# Patient Record
Sex: Male | Born: 1975
Health system: Southern US, Community
[De-identification: ages and names within clinical notes are randomized; demographics above are authoritative.]

## PROBLEM LIST (undated history)

## (undated) DIAGNOSIS — F1910 Other psychoactive substance abuse, uncomplicated: Secondary | ICD-10-CM

---

## 2018-01-16 ENCOUNTER — Emergency Department (HOSPITAL_COMMUNITY): Payer: Medicaid Other

## 2018-01-16 ENCOUNTER — Other Ambulatory Visit: Payer: Self-pay

## 2018-01-16 ENCOUNTER — Encounter (HOSPITAL_COMMUNITY): Payer: Self-pay | Admitting: Radiology

## 2018-01-16 ENCOUNTER — Inpatient Hospital Stay (HOSPITAL_COMMUNITY)
Admission: EM | Admit: 2018-01-16 | Discharge: 2018-02-15 | DRG: 296 | Disposition: E | Payer: Medicaid Other | Attending: Pulmonary Disease | Admitting: Pulmonary Disease

## 2018-01-16 DIAGNOSIS — I959 Hypotension, unspecified: Secondary | ICD-10-CM | POA: Diagnosis present

## 2018-01-16 DIAGNOSIS — N179 Acute kidney failure, unspecified: Secondary | ICD-10-CM | POA: Diagnosis present

## 2018-01-16 DIAGNOSIS — F1510 Other stimulant abuse, uncomplicated: Secondary | ICD-10-CM | POA: Diagnosis present

## 2018-01-16 DIAGNOSIS — G9382 Brain death: Secondary | ICD-10-CM | POA: Diagnosis present

## 2018-01-16 DIAGNOSIS — R402112 Coma scale, eyes open, never, at arrival to emergency department: Secondary | ICD-10-CM | POA: Diagnosis present

## 2018-01-16 DIAGNOSIS — E875 Hyperkalemia: Secondary | ICD-10-CM | POA: Diagnosis present

## 2018-01-16 DIAGNOSIS — R740 Nonspecific elevation of levels of transaminase and lactic acid dehydrogenase [LDH]: Secondary | ICD-10-CM | POA: Diagnosis present

## 2018-01-16 DIAGNOSIS — G936 Cerebral edema: Secondary | ICD-10-CM | POA: Diagnosis present

## 2018-01-16 DIAGNOSIS — G931 Anoxic brain damage, not elsewhere classified: Secondary | ICD-10-CM | POA: Diagnosis present

## 2018-01-16 DIAGNOSIS — E7889 Other lipoprotein metabolism disorders: Secondary | ICD-10-CM

## 2018-01-16 DIAGNOSIS — R402212 Coma scale, best verbal response, none, at arrival to emergency department: Secondary | ICD-10-CM | POA: Diagnosis present

## 2018-01-16 DIAGNOSIS — R0989 Other specified symptoms and signs involving the circulatory and respiratory systems: Secondary | ICD-10-CM

## 2018-01-16 DIAGNOSIS — Z452 Encounter for adjustment and management of vascular access device: Secondary | ICD-10-CM

## 2018-01-16 DIAGNOSIS — J9601 Acute respiratory failure with hypoxia: Secondary | ICD-10-CM

## 2018-01-16 DIAGNOSIS — J9600 Acute respiratory failure, unspecified whether with hypoxia or hypercapnia: Secondary | ICD-10-CM | POA: Diagnosis present

## 2018-01-16 DIAGNOSIS — I469 Cardiac arrest, cause unspecified: Principal | ICD-10-CM

## 2018-01-16 DIAGNOSIS — R402312 Coma scale, best motor response, none, at arrival to emergency department: Secondary | ICD-10-CM | POA: Diagnosis present

## 2018-01-16 DIAGNOSIS — E8889 Other specified metabolic disorders: Secondary | ICD-10-CM

## 2018-01-16 HISTORY — DX: Other psychoactive substance abuse, uncomplicated: F19.10

## 2018-01-16 LAB — RAPID URINE DRUG SCREEN, HOSP PERFORMED
Amphetamines: POSITIVE — AB
BENZODIAZEPINES: NOT DETECTED
Barbiturates: NOT DETECTED
Cocaine: NOT DETECTED
OPIATES: NOT DETECTED
Tetrahydrocannabinol: NOT DETECTED

## 2018-01-16 LAB — COOXEMETRY PANEL
Carboxyhemoglobin: 2.3 % — ABNORMAL HIGH (ref 0.5–1.5)
Methemoglobin: 1.2 % (ref 0.0–1.5)
O2 SAT: 97.8 %
TOTAL HEMOGLOBIN: 12.9 g/dL (ref 12.0–16.0)

## 2018-01-16 LAB — CBC
HCT: 39.4 % (ref 39.0–52.0)
HCT: 42.7 % (ref 39.0–52.0)
HEMOGLOBIN: 14.2 g/dL (ref 13.0–17.0)
Hemoglobin: 12.3 g/dL — ABNORMAL LOW (ref 13.0–17.0)
MCH: 30.4 pg (ref 26.0–34.0)
MCH: 31.3 pg (ref 26.0–34.0)
MCHC: 31.2 g/dL (ref 30.0–36.0)
MCHC: 33.3 g/dL (ref 30.0–36.0)
MCV: 97.3 fL (ref 78.0–100.0)
MCV: 94.1 fL (ref 78.0–100.0)
PLATELETS: 146 10*3/uL — AB (ref 150–400)
Platelets: 207 10*3/uL (ref 150–400)
RBC: 4.05 MIL/uL — ABNORMAL LOW (ref 4.22–5.81)
RBC: 4.54 MIL/uL (ref 4.22–5.81)
RDW: 14.8 % (ref 11.5–15.5)
RDW: 14.6 % (ref 11.5–15.5)
WBC: 24.8 10*3/uL — AB (ref 4.0–10.5)
WBC: 20 10*3/uL — ABNORMAL HIGH (ref 4.0–10.5)

## 2018-01-16 LAB — URINALYSIS, ROUTINE W REFLEX MICROSCOPIC
BACTERIA UA: NONE SEEN
BILIRUBIN URINE: NEGATIVE
Glucose, UA: 50 mg/dL — AB
KETONES UR: NEGATIVE mg/dL
Leukocytes, UA: NEGATIVE
Nitrite: NEGATIVE
Protein, ur: 100 mg/dL — AB
Specific Gravity, Urine: 1.014 (ref 1.005–1.030)
WBC UA: NONE SEEN WBC/hpf (ref 0–5)
pH: 7 (ref 5.0–8.0)

## 2018-01-16 LAB — POCT I-STAT 3, ART BLOOD GAS (G3+)
Acid-base deficit: 2 mmol/L (ref 0.0–2.0)
Bicarbonate: 29.5 mmol/L — ABNORMAL HIGH (ref 20.0–28.0)
O2 Saturation: 100 %
PCO2 ART: 77 mmHg — AB (ref 32.0–48.0)
Patient temperature: 33.9
TCO2: 32 mmol/L (ref 22–32)
pH, Arterial: 7.172 — CL (ref 7.350–7.450)
pO2, Arterial: 419 mmHg — ABNORMAL HIGH (ref 83.0–108.0)

## 2018-01-16 LAB — BASIC METABOLIC PANEL
Anion gap: 17 — ABNORMAL HIGH (ref 5–15)
BUN: 18 mg/dL (ref 6–20)
CALCIUM: 8.3 mg/dL — AB (ref 8.9–10.3)
CHLORIDE: 106 mmol/L (ref 101–111)
CO2: 16 mmol/L — ABNORMAL LOW (ref 22–32)
CREATININE: 1.58 mg/dL — AB (ref 0.61–1.24)
GFR calc Af Amer: >60 mL/min (ref >60)
GFR calc non Af Amer: 52 mL/min — ABNORMAL LOW (ref >60)
Glucose, Bld: 129 mg/dL — ABNORMAL HIGH (ref 65–99)
Potassium: 5.5 mmol/L — ABNORMAL HIGH (ref 3.5–5.1)
SODIUM: 139 mmol/L (ref 135–145)

## 2018-01-16 LAB — HEPATIC FUNCTION PANEL
ALBUMIN: 3.2 g/dL — AB (ref 3.5–5.0)
ALT: 271 U/L — ABNORMAL HIGH (ref 17–63)
AST: 446 U/L — ABNORMAL HIGH (ref 15–41)
Alkaline Phosphatase: 125 U/L (ref 38–126)
BILIRUBIN TOTAL: 0.8 mg/dL (ref 0.3–1.2)
Bilirubin, Direct: 0.2 mg/dL (ref 0.1–0.5)
Indirect Bilirubin: 0.6 mg/dL (ref 0.3–0.9)
Total Protein: 5.8 g/dL — ABNORMAL LOW (ref 6.5–8.1)

## 2018-01-16 LAB — I-STAT CG4 LACTIC ACID, ED: Lactic Acid, Venous: 9.94 mmol/L (ref 0.5–1.9)

## 2018-01-16 LAB — MRSA PCR SCREENING: MRSA by PCR: NEGATIVE

## 2018-01-16 LAB — TROPONIN I: TROPONIN I: 0.24 ng/mL — AB (ref <0.03)

## 2018-01-16 LAB — CREATININE, SERUM
Creatinine, Ser: 1.52 mg/dL — ABNORMAL HIGH (ref 0.61–1.24)
GFR calc Af Amer: >60 mL/min (ref >60)
GFR calc non Af Amer: 55 mL/min — ABNORMAL LOW (ref >60)

## 2018-01-16 LAB — MAGNESIUM: Magnesium: 2.8 mg/dL — ABNORMAL HIGH (ref 1.7–2.4)

## 2018-01-16 LAB — I-STAT TROPONIN, ED: TROPONIN I, POC: 0.01 ng/mL (ref 0.00–0.08)

## 2018-01-16 LAB — ETHANOL

## 2018-01-16 LAB — AMMONIA: Ammonia: 87 umol/L — ABNORMAL HIGH (ref 9–35)

## 2018-01-16 MED ORDER — IOPAMIDOL (ISOVUE-300) INJECTION 61%
INTRAVENOUS | Status: AC
  Administered 2018-01-16: 100 mL
  Filled 2018-01-16: qty 100

## 2018-01-16 MED ORDER — CHLORHEXIDINE GLUCONATE 0.12% ORAL RINSE (MEDLINE KIT)
15.0000 mL | Freq: Two times a day (BID) | OROMUCOSAL | Status: DC
  Administered 2018-01-16 – 2018-01-18 (×4): 15 mL via OROMUCOSAL

## 2018-01-16 MED ORDER — ENOXAPARIN SODIUM 40 MG/0.4ML ~~LOC~~ SOLN
40.0000 mg | SUBCUTANEOUS | Status: DC
  Administered 2018-01-16: 40 mg via SUBCUTANEOUS
  Filled 2018-01-16: qty 0.4

## 2018-01-16 MED ORDER — DEXTROSE 5 % IV SOLN
0.0000 ug/min | Freq: Once | INTRAVENOUS | Status: AC
  Administered 2018-01-16: 10 ug/min via INTRAVENOUS

## 2018-01-16 MED ORDER — NOREPINEPHRINE BITARTRATE 1 MG/ML IV SOLN
0.0000 ug/min | Freq: Once | INTRAVENOUS | Status: AC
  Administered 2018-01-16: 35 ug/min via INTRAVENOUS
  Filled 2018-01-16 (×3): qty 4

## 2018-01-16 MED ORDER — SODIUM CHLORIDE 0.9 % IV BOLUS (SEPSIS): 1000.0000 mL | Freq: Once | INTRAVENOUS | Status: DC

## 2018-01-16 MED ORDER — SODIUM CHLORIDE 0.9 % IV SOLN
250.0000 mL | INTRAVENOUS | Status: DC | PRN
  Administered 2018-01-16: 250 mL via INTRAVENOUS
  Administered 2018-01-17: 10 mL via INTRAVENOUS

## 2018-01-16 MED ORDER — SODIUM CHLORIDE 0.9 % IV SOLN
INTRAVENOUS | Status: AC | PRN
  Administered 2018-01-16: 1000 mL via INTRAVENOUS

## 2018-01-16 MED ORDER — PIPERACILLIN-TAZOBACTAM 3.375 G IVPB
3.3750 g | Freq: Three times a day (TID) | INTRAVENOUS | Status: DC
  Administered 2018-01-16 – 2018-01-18 (×5): 3.375 g via INTRAVENOUS
  Filled 2018-01-16 (×6): qty 50

## 2018-01-16 MED ORDER — SODIUM CHLORIDE 0.9 % IV SOLN: INTRAVENOUS | Status: DC

## 2018-01-16 MED ORDER — SODIUM BICARBONATE 8.4 % IV SOLN
INTRAVENOUS | Status: DC
  Administered 2018-01-16 – 2018-01-17 (×6): via INTRAVENOUS
  Filled 2018-01-16 (×14): qty 150

## 2018-01-16 MED ORDER — SODIUM CHLORIDE 0.9 % IV BOLUS (SEPSIS)
1000.0000 mL | Freq: Once | INTRAVENOUS | Status: AC
  Administered 2018-01-16: 1000 mL via INTRAVENOUS

## 2018-01-16 MED ORDER — VASOPRESSIN 20 UNIT/ML IV SOLN
0.0400 [IU]/min | INTRAVENOUS | Status: DC
Start: 2018-01-16 — End: 2018-01-19
  Administered 2018-01-16: 0.04 [IU]/min via INTRAVENOUS
  Filled 2018-01-16 (×2): qty 2

## 2018-01-16 MED ORDER — NALOXONE HCL 2 MG/2ML IJ SOSY: 1.0000 mg | PREFILLED_SYRINGE | Freq: Once | INTRAMUSCULAR | Status: DC

## 2018-01-16 MED ORDER — SODIUM BICARBONATE 8.4 % IV SOLN
50.0000 meq | Freq: Once | INTRAVENOUS | Status: AC
  Administered 2018-01-16: 50 meq via INTRAVENOUS

## 2018-01-16 MED ORDER — NOREPINEPHRINE BITARTRATE 1 MG/ML IV SOLN
5.0000 ug/min | INTRAVENOUS | Status: DC
  Administered 2018-01-16: 40 ug/min via INTRAVENOUS
  Filled 2018-01-16 (×2): qty 4

## 2018-01-16 MED ORDER — FAMOTIDINE IN NACL 20-0.9 MG/50ML-% IV SOLN
20.0000 mg | Freq: Two times a day (BID) | INTRAVENOUS | Status: DC
  Administered 2018-01-16 – 2018-01-18 (×4): 20 mg via INTRAVENOUS
  Filled 2018-01-16 (×4): qty 50

## 2018-01-16 MED ORDER — ORAL CARE MOUTH RINSE
15.0000 mL | OROMUCOSAL | Status: DC
  Administered 2018-01-16 – 2018-01-18 (×16): 15 mL via OROMUCOSAL

## 2018-01-16 NOTE — Progress Notes (Signed)
Responded to page for family of this patient.  Place family in consultation room and accompanied MD to update family.  Family lives sort of estranged.  Biological father - Joshua DevonCarl W. Colon [phone]) says he  Is the next of kin but hasn't seen his son in over a month or two or longer.  States that the son doesn't visit him.  Step-mother Joshua Colon [phone]).  Biological mother hasn't seen him in over 40 years.  Other family members may be visiting.  Father and step mom left to feed horses and said they would call back or visit once he is in a room.  Chaplain will follow up as needed.    02/01/2018 1630  Clinical Encounter Type  Visited With Family;Health care provider;Patient not available  Visit Type Initial;Spiritual support;ED;Trauma

## 2018-01-16 NOTE — ED Notes (Signed)
Attempted to insert temp foley , meet resistance unable to advance 2nd RN to attempt

## 2018-01-16 NOTE — ED Notes (Signed)
[facility] notified , referral number [id number]

## 2018-01-16 NOTE — H&P (Addendum)
PULMONARY / CRITICAL CARE MEDICINE   Name: Joshua Colon MRN: [id number] DOB: 08-19-1976    ADMISSION DATE:  February 06, 2018  CHIEF COMPLAINT: Out of hospital cardiac arrest.  HISTORY OF PRESENT ILLNESS:       The patient was found unresponsive in the field with an unknown downtime.  He was apneic and pulseless when EMS arrived.  CPR was initiated he was given 1 mg of epinephrine followed by several small boluses and return of circulation was established.  He was reintubated in the department of emergency medicine and did not require any drugs for intubation.  Family has left apparently they reported a history of drug abuse before departing.  PAST MEDICAL HISTORY :  He  has a past medical history of Drug abuse (HCC).  PAST SURGICAL HISTORY: He  has no past surgical history on file.  No Known Allergies  No current facility-administered medications on file prior to encounter.    No current outpatient medications on file prior to encounter.    FAMILY HISTORY:  His has no family status information on file.    SOCIAL HISTORY: He  reports that he drinks alcohol. He reports that he uses drugs.  REVIEW OF SYSTEMS:   Unobtainable  SUBJECTIVE:  Unobtainable  VITAL SIGNS: BP 103/73   Pulse 85   Temp (!) 95.9 F (35.5 C) (Temporal)   Resp 16   Ht 5' 9.5" (1.765 m)   Wt 130 lb (59 kg)   SpO2 100%   BMI 18.92 kg/m   HEMODYNAMICS:    VENTILATOR SETTINGS: Vent Mode: PRVC FiO2 (%):  [100 %] 100 % Set Rate:  [16 bmp] 16 bmp Vt Set:  [600 mL] 600 mL PEEP:  [5 cmH20] 5 cmH20  INTAKE / OUTPUT: No intake/output data recorded.  PHYSICAL EXAMINATION: General: This is a thin male who appears his stated age who is orally intubated and mechanically ventilated Neuro: There is no response to voice loud noise or sternal rub.  Pupils are fixed at 7 mm.  EOMs are absent by doll's eyes.  Corneals are absent.  Cough is absent.  Gag is absent. Cardiovascular: S1 and S2 are regular  without murmur rub or gallop. Lungs: There is symmetric air movement some scattered rhonchi and no wheezes. Abdomen: The abdomen is flat and soft without any organomegaly masses or tenderness.  He is anicteric. Skin: Appreciate any track marks or edema extremities warm  LABS:  BMET No results for input(s): NA, K, CL, CO2, BUN, CREATININE, GLUCOSE in the last 168 hours.  Electrolytes No results for input(s): CALCIUM, MG, PHOS in the last 168 hours.  CBC Recent Labs  Lab 02-06-18 1600  WBC 24.8*  HGB 12.3*  HCT 39.4  PLT 146*    Coag's No results for input(s): APTT, INR in the last 168 hours.  Sepsis Markers Recent Labs  Lab 02-06-2018 1607  LATICACIDVEN 9.94*    ABG No results for input(s): PHART, PCO2ART, PO2ART in the last 168 hours.  Liver Enzymes No results for input(s): AST, ALT, ALKPHOS, BILITOT, ALBUMIN in the last 168 hours.  Cardiac Enzymes No results for input(s): TROPONINI, PROBNP in the last 168 hours.  Glucose No results for input(s): GLUCAP in the last 168 hours.  Imaging Dg Chest Portable 1 View  Result Date: 02-06-18 CLINICAL DATA:  Status post CPR EXAM: PORTABLE CHEST 1 VIEW COMPARISON:  None. FINDINGS: Endotracheal tube with the tip 4.9 cm above the carina. There is no focal parenchymal opacity. There is no  pleural effusion or pneumothorax. The heart and mediastinal contours are unremarkable. The osseous structures are unremarkable. IMPRESSION: Endotracheal tube with the tip 4.9 cm above the carina. Electronically Signed   By: Elige KoHetal  Patel   On: 01/20/2018 16:19   Dg Abd Portable 1 View  Result Date: 01/27/2018 CLINICAL DATA:  Orogastric tube placement. EXAM: PORTABLE ABDOMEN - 1 VIEW COMPARISON:  None. FINDINGS: Orogastric tube tip passes below the diaphragm. Tip lies in the proximal to mid stomach. Side hole lies at the level of the gastroesophageal junction. IMPRESSION: Orogastric tube tip within the proximal to mid stomach. Electronically Signed    By: Amie Portlandavid  Ormond M.D.   On: 01/21/2018 16:14     STUDIES:  CT scan of the head to my eye shows diffuse cerebral edema with a most complete obliteration of the sulci.  I am awaiting  radiology's interpretation.   SIGNIFICANT EVENTS:   DISCUSSION:      This is a 42 year old with a reported history of drug abuse who was found down in the field.  Downtime is unknown.  CT scan following CPR is already showing diffuse cerebral edema and by my exam the patient shows no evidence of cortical or brainstem activity.  I cannot formally pronounced brain dead until I see a toxicology screen and his electrolytes however I am convinced that this is a situation beyond salvage based on the CT scan and I will not be initiating a hypothermia protocol.  I am anticipating further manifestations of brain death including diabetes insipidus and poikilothermia.  Greater than 32 minutes was spent in the care of this acutely unstable patient today   Penny PiaWJ Gray, MD Pulmonary and Critical Care Medicine [facility] Pager: [phone]  02/11/2018, 5:12 PM  ADDENDUM (9:22 PM): CTSP by E-link for "increasing pressor requirements". Notes reviewed. Case discussed with nurse. Apparently, there were 2 different order for norepinephrine in the computer with discrepancy in max dosing. BP 101/83 on norepinephrine @ 37 mcg/min. On physical exam, no Doll's eyes, no response to noxious stimuli. Synchronous with ventilator at RR 24. No ABG on file.  Will get ABG. If pH > 7.4 and hypocapnic, will not plan to make significant changes to allow hyperventilation. Add vasopressin PRN for blood pressure support.  Marcelle SmilingSeong-Joo Merritt Kibby, MD

## 2018-01-16 NOTE — ED Notes (Signed)
After 2 RN's attempted temp foley, Dr Denton LankSteinl placed a coude foley

## 2018-01-16 NOTE — ED Notes (Signed)
I Stat Lactic Acid results 9.94 READ TO DR Denton LankSTEINL, REPEATED BACK

## 2018-01-16 NOTE — ED Notes (Signed)
Pt arrived with [place] ems after being found in his yard apneic and pulseless. Unknown event and unknown downtime. CPR started immediately, rosc achieved in routearound 1515. Pt received 1 epi infection and 4 pressor epi pushes. BP 80/40, NSR 96 bpm. Pt is intubated, has an IV and an IO. GCS 3. Pupils dilated and nonreactive. Pt has pulse.

## 2018-01-16 NOTE — ED Notes (Signed)
Pt back from CT escorted by [place] and RT

## 2018-01-16 NOTE — Progress Notes (Signed)
Pharmacy Antibiotic Note  Joshua Colon is a 42 y.o. male admitted on 01/26/2018 after an unknown downtime and unresponsiveness with ROSC after CPR.  Pharmacy has been consulted for Zosyn dosing for aspiration PNA coverage.   Plan: 1. Zosyn 3.375g IV every 8 hours infused over 4 hours 2. Will continue to follow renal function, culture results, LOT, and antibiotic de-escalation plans   Height: 5' 9.5" (176.5 cm) Weight: 130 lb (59 kg) IBW/kg (Calculated) : 71.85  Temp (24hrs), Avg:95.9 F (35.5 C), Min:95.9 F (35.5 C), Max:95.9 F (35.5 C)  Recent Labs  Lab 02/01/2018 1600 01/24/2018 1607  WBC 24.8*  --   CREATININE 1.58*  --   LATICACIDVEN  --  9.94*    Estimated Creatinine Clearance: 50.3 mL/min (A) (by C-G formula based on SCr of 1.58 mg/dL (H)).    No Known Allergies  Antimicrobials this admission: Zosyn 3/2 >>  Dose adjustments this admission: n/a  Microbiology results: n/a  Thank you for allowing pharmacy to be a part of this patient's care.  Rolley SimsMartin, Jabe Jeanbaptiste Ann 01/19/2018 5:44 PM

## 2018-01-16 NOTE — ED Provider Notes (Signed)
[facility]H CARDIOVASCULAR ICU Provider Note   CSN: [id number] Arrival date & time: 01/27/2018  1529     History   Chief Complaint Chief Complaint  Patient presents with  . Cardiac Arrest    HPI Markeith Jue is a 42 y.o. male.  HPI  42 year old male history of illicit drug abuse presents the emergency department after family members found the patient down for unknown period of time in the front yard.  EMS found the patient apneic/pulseless in PEA arrest subsequently given 1 dose of epinephrine along with push dose levo fed.  Patient had airway secured with Edison Pace LT subsequently had ROS C.  Past Medical History:  Diagnosis Date  . Drug abuse [facility])     Patient Active Problem List   Diagnosis Date Noted  . Anoxic brain damage ([place]) 01/15/2018    History reviewed. No pertinent surgical history.     Home Medications    Prior to Admission medications   Not on File    Family History History reviewed. No pertinent family history.  Social History Social History   Tobacco Use  . Smoking status: Unknown If Ever Smoked  Substance Use Topics  . Alcohol use: Yes    Comment: family states "pt had been using alchol today"  . Drug use: Yes    Comment: hx of drug abuse per family     Allergies   Patient has no known allergies.   Review of Systems Review of Systems  Unable to perform ROS: Acuity of condition     Physical Exam Updated Vital Signs BP 94/60   Pulse (!) 112   Temp (!) 92.3 F (33.5 C)   Resp (!) 24   Ht 5' 9.5" (1.765 m)   Wt 59 kg (130 lb)   SpO2 100%   BMI 18.92 kg/m   Physical Exam  Physical Exam Vitals:   02/01/2018 2030 01/27/2018 2100  BP: (!) 87/61 94/60  Pulse:    Resp: (!) 24 (!) 24  Temp:  (!) 92.3 F (33.5 C)  SpO2:     Constitutional: Patient is in severe acute distress Head: Normocephalic and atraumatic.  Eyes: Extraocular motion intact, no scleral icterus; 38m dilated nonreactive equal Neck: Supple without  meningismus, mass, or overt JVD Respiratory: BVM breath sounds not appreciated;  CV: Heart regular rate and rhythm, no obvious murmurs.  Pulses +2 and symmetric Abdomen: distended MSK: Extremities are atraumatic without deformity, ROM intact Skin: Warm, dry, intact Neuro: GCS 3   ED Treatments / Results  Labs (all labs ordered are listed, but only abnormal results are displayed) Labs Reviewed  HEPATIC FUNCTION PANEL - Abnormal; Notable for the following components:      Result Value   Total Protein 5.8 (*)    Albumin 3.2 (*)    AST 446 (*)    ALT 271 (*)    All other components within normal limits  BASIC METABOLIC PANEL - Abnormal; Notable for the following components:   Potassium 5.5 (*)    CO2 16 (*)    Glucose, Bld 129 (*)    Creatinine, Ser 1.58 (*)    Calcium 8.3 (*)    GFR calc non Af Amer 52 (*)    Anion gap 17 (*)    All other components within normal limits  CBC - Abnormal; Notable for the following components:   WBC 24.8 (*)    RBC 4.05 (*)    Hemoglobin 12.3 (*)    Platelets 146 (*)  All other components within normal limits  RAPID URINE DRUG SCREEN, HOSP PERFORMED - Abnormal; Notable for the following components:   Amphetamines POSITIVE (*)    All other components within normal limits  URINALYSIS, ROUTINE W REFLEX MICROSCOPIC - Abnormal; Notable for the following components:   APPearance HAZY (*)    Glucose, UA 50 (*)    Hgb urine dipstick LARGE (*)    Protein, ur 100 (*)    Squamous Epithelial / LPF 0-5 (*)    All other components within normal limits  COOXEMETRY PANEL - Abnormal; Notable for the following components:   Carboxyhemoglobin 2.3 (*)    All other components within normal limits  AMMONIA - Abnormal; Notable for the following components:   Ammonia 87 (*)    All other components within normal limits  TROPONIN I - Abnormal; Notable for the following components:   Troponin I 0.24 (*)    All other components within normal limits  MAGNESIUM -  Abnormal; Notable for the following components:   Magnesium 2.8 (*)    All other components within normal limits  CBC - Abnormal; Notable for the following components:   WBC 20.0 (*)    All other components within normal limits  CREATININE, SERUM - Abnormal; Notable for the following components:   Creatinine, Ser 1.52 (*)    GFR calc non Af Amer 55 (*)    All other components within normal limits  I-STAT CG4 LACTIC ACID, ED - Abnormal; Notable for the following components:   Lactic Acid, Venous 9.94 (*)    All other components within normal limits  MRSA PCR SCREENING  ETHANOL  BLOOD GAS, ARTERIAL  CARBOXYHEMOGLOBIN - COOX  TROPONIN I  TROPONIN I  DRUG PROFILE, UR, 9 DRUGS ([facility])  HIV ANTIBODY (ROUTINE TESTING)  BLOOD GAS, ARTERIAL  PHOSPHORUS  MAGNESIUM  CBC  COMPREHENSIVE METABOLIC PANEL  I-STAT CHEM 8, ED  I-STAT TROPONIN, ED  I-STAT TROPONIN, ED  I-STAT CHEM 8, ED  I-STAT CG4 LACTIC ACID, ED    EKG  EKG Interpretation  Date/Time:  Saturday January 16 2018 15:38:36 EST Ventricular Rate:  83 PR Interval:    QRS Duration: 88 QT Interval:  433 QTC Calculation: 509 R Axis:   27 Text Interpretation:  Sinus rhythm Nonspecific T wave abnormality Prolonged QT interval Confirmed by Lajean Saver [phone]) on 01/15/2018 3:45:30 PM       Radiology Ct Head Wo Contrast  Result Date: 01/25/2018 CLINICAL DATA:  42 year old male found down in his yard. Apneic and pulseless upon arrival. Pupils are dilated and nonreactive. EXAM: CT HEAD WITHOUT CONTRAST CT CERVICAL SPINE WITHOUT CONTRAST TECHNIQUE: Multidetector CT imaging of the head and cervical spine was performed following the standard protocol without intravenous contrast. Multiplanar CT image reconstructions of the cervical spine were also generated. COMPARISON:  None. FINDINGS: CT HEAD FINDINGS: Brain: Differentiation mild diffuse loss. Diffuse of gray-white effacement of the cortical sulci. Effacement of the basal cisterns.  All these findings suggest diffuse brain edema, likely from anoxic injury. No acute intracranial hemorrhage, hydrocephalus or mass lesion. Vascular: No hyperdense vessel or unexpected calcification. Skull: Normal. Negative for fracture or focal lesion. Sinuses/Orbits: No acute finding. Other: None. CT CERVICAL SPINE FINDINGS Alignment: Normal. Skull base and vertebrae: No acute fracture. No primary bone lesion or focal pathologic process. Soft tissues and spinal canal: No prevertebral fluid or swelling. No visible canal hematoma. Disc levels:  No significant degenerative disc disease. Upper chest: Unremarkable. Other: Intubated patient. Nasogastric tube is mildly  coiled in the oropharynx. IMPRESSION: 1. Appearance of the brain suggests diffuse cerebral and cerebellar edema related to recent anoxic brain injury, as above. 2. No acute abnormality of the cervical spine. Critical Value/emergent results were called by telephone at the time of interpretation on 02/04/2018 at 5:23 pm to Dr. Lajean Saver, who verbally acknowledged these results. Electronically Signed   By: Vinnie Langton M.D.   On: 01/15/2018 17:24   Ct Cervical Spine Wo Contrast  Result Date: 02/08/2018 CLINICAL DATA:  42 year old male found down in his yard. Apneic and pulseless upon arrival. Pupils are dilated and nonreactive. EXAM: CT HEAD WITHOUT CONTRAST CT CERVICAL SPINE WITHOUT CONTRAST TECHNIQUE: Multidetector CT imaging of the head and cervical spine was performed following the standard protocol without intravenous contrast. Multiplanar CT image reconstructions of the cervical spine were also generated. COMPARISON:  None. FINDINGS: CT HEAD FINDINGS: Brain: Differentiation mild diffuse loss. Diffuse of gray-white effacement of the cortical sulci. Effacement of the basal cisterns. All these findings suggest diffuse brain edema, likely from anoxic injury. No acute intracranial hemorrhage, hydrocephalus or mass lesion. Vascular: No hyperdense  vessel or unexpected calcification. Skull: Normal. Negative for fracture or focal lesion. Sinuses/Orbits: No acute finding. Other: None. CT CERVICAL SPINE FINDINGS Alignment: Normal. Skull base and vertebrae: No acute fracture. No primary bone lesion or focal pathologic process. Soft tissues and spinal canal: No prevertebral fluid or swelling. No visible canal hematoma. Disc levels:  No significant degenerative disc disease. Upper chest: Unremarkable. Other: Intubated patient. Nasogastric tube is mildly coiled in the oropharynx. IMPRESSION: 1. Appearance of the brain suggests diffuse cerebral and cerebellar edema related to recent anoxic brain injury, as above. 2. No acute abnormality of the cervical spine. Critical Value/emergent results were called by telephone at the time of interpretation on 02/08/2018 at 5:23 pm to Dr. Lajean Saver, who verbally acknowledged these results. Electronically Signed   By: Vinnie Langton M.D.   On: 01/20/2018 17:24   Ct Abdomen Pelvis W Contrast  Result Date: 01/15/2018 CLINICAL DATA:  42 year old male found down in his yard apneic and pulseless. Pupils are dilated and nonreactive. EXAM: CT ABDOMEN AND PELVIS WITH CONTRAST TECHNIQUE: Multidetector CT imaging of the abdomen and pelvis was performed using the standard protocol following bolus administration of intravenous contrast. CONTRAST:  145m ISOVUE-300 IOPAMIDOL (ISOVUE-300) INJECTION 61% COMPARISON:  None. FINDINGS: Lower chest: Areas of dependent atelectasis are noted in the lower lobes of the lungs bilaterally. Hepatobiliary: No cystic or solid hepatic lesions. No intra or extrahepatic biliary ductal dilatation. Gallbladder is nearly completely decompressed, but otherwise unremarkable in appearance. Pancreas: No pancreatic mass. No pancreatic ductal dilatation. No pancreatic or peripancreatic fluid or inflammatory changes. Spleen: Unremarkable. Adrenals/Urinary Tract: Bilateral kidneys and bilateral adrenal glands are  normal in appearance. No hydroureteronephrosis. Urinary bladder is normal in appearance. Foley balloon catheter with tip in the lumen of the urinary bladder. Small amount of gas non dependently in the lumen of the urinary bladder, presumably iatrogenic. Stomach/Bowel: Nasogastric tube terminating in the body of the stomach. Stomach is mildly distended, but otherwise unremarkable in appearance. No pathologic dilatation of small bowel or colon. Normal appendix. Vascular/Lymphatic: Aortic atherosclerosis, without evidence of aneurysm or dissection in the abdominal or pelvic vasculature. No lymphadenopathy noted in the abdomen or pelvis. Reproductive: Prostate gland and seminal vesicles are unremarkable in appearance. Other: No significant volume of ascites.  No pneumoperitoneum. Musculoskeletal: No acute displaced fractures or aggressive appearing lytic or blastic lesions are noted in the visualized portions of the  skeleton. IMPRESSION: 1. No acute findings are noted in the abdomen or pelvis. 2. Bibasilar subsegmental atelectasis in the lower lobes of the lungs bilaterally. 3. Aortic atherosclerosis. Aortic Atherosclerosis (ICD10-I70.0). Electronically Signed   By: Vinnie Langton M.D.   On: 01/24/2018 17:28   Dg Chest Portable 1 View  Result Date: 02/03/2018 CLINICAL DATA:  Status post CPR EXAM: PORTABLE CHEST 1 VIEW COMPARISON:  None. FINDINGS: Endotracheal tube with the tip 4.9 cm above the carina. There is no focal parenchymal opacity. There is no pleural effusion or pneumothorax. The heart and mediastinal contours are unremarkable. The osseous structures are unremarkable. IMPRESSION: Endotracheal tube with the tip 4.9 cm above the carina. Electronically Signed   By: Kathreen Devoid   On: 02/12/2018 16:19   Dg Abd Portable 1 View  Result Date: 02/02/2018 CLINICAL DATA:  Orogastric tube placement. EXAM: PORTABLE ABDOMEN - 1 VIEW COMPARISON:  None. FINDINGS: Orogastric tube tip passes below the diaphragm. Tip  lies in the proximal to mid stomach. Side hole lies at the level of the gastroesophageal junction. IMPRESSION: Orogastric tube tip within the proximal to mid stomach. Electronically Signed   By: Lajean Manes M.D.   On: 01/15/2018 16:14    Procedures Procedures (including critical care time)  Medications Ordered in ED Medications  sodium bicarbonate 150 mEq in dextrose 5 % 1,000 mL infusion ( Intravenous New Bag/Given 02/12/2018 2104)  naloxone [facility]) injection 1 mg (not administered)  0.9 %  sodium chloride infusion (not administered)  enoxaparin (LOVENOX) injection 40 mg (40 mg Subcutaneous Given 01/28/2018 1944)  famotidine (PEPCID) IVPB 20 mg premix (not administered)  0.9 %  sodium chloride infusion (not administered)  piperacillin-tazobactam (ZOSYN) IVPB 3.375 g (3.375 g Intravenous New Bag/Given 02/14/2018 2151)  chlorhexidine gluconate (MEDLINE KIT) (PERIDEX) 0.12 % solution 15 mL (15 mLs Mouth Rinse Given 01/29/2018 1853)  MEDLINE mouth rinse (not administered)  sodium chloride 0.9 % bolus 1,000 mL (not administered)  vasopressin (PITRESSIN) 40 Units in sodium chloride 0.9 % 250 mL (0.16 Units/mL) infusion (0.04 Units/min Intravenous New Bag/Given 02/01/2018 2141)  norepinephrine (LEVOPHED) 4 mg in dextrose 5 % 250 mL (0.016 mg/mL) infusion (not administered)  norepinephrine (LEVOPHED) 4 mg in dextrose 5 % 250 mL (0.016 mg/mL) infusion (12 mcg/min Intravenous Rate/Dose Change 02/04/2018 1630)  0.9 %  sodium chloride infusion ( Intravenous Stopped 01/26/2018 1612)  sodium bicarbonate injection 50 mEq (50 mEq Intravenous Given 01/31/2018 1618)  iopamidol (ISOVUE-300) 61 % injection (100 mLs  Contrast Given 01/19/2018 1641)  sodium chloride 0.9 % bolus 1,000 mL (0 mLs Intravenous Stopped 02/06/2018 1707)  sodium chloride 0.9 % bolus 1,000 mL (0 mLs Intravenous Stopped 01/31/2018 1707)     Initial Impression / Assessment and Plan / ED Course  I have reviewed the triage vital signs and the nursing notes.  Pertinent labs &  imaging results that were available during my care of the patient were reviewed by me and considered in my medical decision making (see chart for details).     43 year old male history of illicit drug abuse presents the emergency department after family members found the patient down for unknown period of time in the front yard.  EMS found the patient apneic/pulseless in PEA arrest subsequently given 1 dose of epinephrine along with push dose levo fed.   Patient had airway secured with Edison Pace LT subsequently had ROSC. Patient arrived lung sounds were not strongly appreciated.  Reviewed and found endotracheal tube with cuff inflated above the cords curled in  the mouth/airway.  Patient was reintubated without complications.  Upon arrival patient was hypotensive with a palpable pulse.  Levophed drip was initiated.  Airway confirmed with x-ray.  Initial labs showed leukocytosis of 24.8 thousand, hemoglobin 12.3, hyperkalemia 5.5, AK I creatinine 1.58, transaminitis,EtOH undetectable, urine drug screen positive for amphetamines initial troponin 0 0.01, with no findings on EKG concerning for ACS/arrhythmia.  Review of CT imaging to include head/C-spine/abdominal pelvis with only findings  suggestive of anoxic brain injury.    ICU consulted plan for admission for PEA arrest/ROSC with likely anoxic brain injury.   Final Clinical Impressions(s) / ED Diagnoses   Final diagnoses:  None    ED Discharge Orders    None       Willette Alma, DO 01/25/2018 2203    Lajean Saver, MD 01/20/2018 2302

## 2018-01-17 ENCOUNTER — Inpatient Hospital Stay (HOSPITAL_COMMUNITY): Payer: Medicaid Other

## 2018-01-17 DIAGNOSIS — I361 Nonrheumatic tricuspid (valve) insufficiency: Secondary | ICD-10-CM

## 2018-01-17 DIAGNOSIS — G9382 Brain death: Secondary | ICD-10-CM

## 2018-01-17 DIAGNOSIS — R570 Cardiogenic shock: Secondary | ICD-10-CM

## 2018-01-17 LAB — COMPREHENSIVE METABOLIC PANEL
ALBUMIN: 2.7 g/dL — AB (ref 3.5–5.0)
ALK PHOS: 90 U/L (ref 38–126)
ALK PHOS: 57 U/L (ref 38–126)
ALT: 512 U/L — AB (ref 17–63)
ALT: 335 U/L — AB (ref 17–63)
ANION GAP: 14 (ref 5–15)
AST: 988 U/L — AB (ref 15–41)
AST: 527 U/L — AB (ref 15–41)
Albumin: 1.9 g/dL — ABNORMAL LOW (ref 3.5–5.0)
Anion gap: 14 (ref 5–15)
BILIRUBIN TOTAL: 0.6 mg/dL (ref 0.3–1.2)
BUN: 32 mg/dL — AB (ref 6–20)
BUN: 38 mg/dL — AB (ref 6–20)
CALCIUM: 6.3 mg/dL — AB (ref 8.9–10.3)
CHLORIDE: 86 mmol/L — AB (ref 101–111)
CO2: 30 mmol/L (ref 22–32)
CO2: 33 mmol/L — AB (ref 22–32)
CREATININE: 2.76 mg/dL — AB (ref 0.61–1.24)
CREATININE: 3.25 mg/dL — AB (ref 0.61–1.24)
Calcium: 5.3 mg/dL — CL (ref 8.9–10.3)
Chloride: 93 mmol/L — ABNORMAL LOW (ref 101–111)
GFR calc Af Amer: 31 mL/min — ABNORMAL LOW (ref >60)
GFR calc Af Amer: 25 mL/min — ABNORMAL LOW (ref >60)
GFR calc non Af Amer: 27 mL/min — ABNORMAL LOW (ref >60)
GFR, EST NON AFRICAN AMERICAN: 22 mL/min — AB (ref >60)
GLUCOSE: 129 mg/dL — AB (ref 65–99)
Glucose, Bld: 202 mg/dL — ABNORMAL HIGH (ref 65–99)
Potassium: 3.6 mmol/L (ref 3.5–5.1)
Potassium: 2.6 mmol/L — CL (ref 3.5–5.1)
SODIUM: 137 mmol/L (ref 135–145)
SODIUM: 133 mmol/L — AB (ref 135–145)
Total Bilirubin: 1 mg/dL (ref 0.3–1.2)
Total Protein: 4.9 g/dL — ABNORMAL LOW (ref 6.5–8.1)
Total Protein: 3.6 g/dL — ABNORMAL LOW (ref 6.5–8.1)

## 2018-01-17 LAB — URINALYSIS, ROUTINE W REFLEX MICROSCOPIC
BILIRUBIN URINE: NEGATIVE
Glucose, UA: 150 mg/dL — AB
Ketones, ur: NEGATIVE mg/dL
Nitrite: NEGATIVE
Protein, ur: 100 mg/dL — AB
SPECIFIC GRAVITY, URINE: 1.027 (ref 1.005–1.030)
pH: 6 (ref 5.0–8.0)

## 2018-01-17 LAB — POCT I-STAT 3, ART BLOOD GAS (G3+)
ACID-BASE EXCESS: 4 mmol/L — AB (ref 0.0–2.0)
ACID-BASE EXCESS: 15 mmol/L — AB (ref 0.0–2.0)
Acid-Base Excess: 10 mmol/L — ABNORMAL HIGH (ref 0.0–2.0)
Acid-Base Excess: 10 mmol/L — ABNORMAL HIGH (ref 0.0–2.0)
Acid-Base Excess: 11 mmol/L — ABNORMAL HIGH (ref 0.0–2.0)
BICARBONATE: 30.9 mmol/L — AB (ref 20.0–28.0)
BICARBONATE: 35 mmol/L — AB (ref 20.0–28.0)
BICARBONATE: 41.4 mmol/L — AB (ref 20.0–28.0)
BICARBONATE: 36.3 mmol/L — AB (ref 20.0–28.0)
BICARBONATE: 37.4 mmol/L — AB (ref 20.0–28.0)
O2 SAT: 94 %
O2 Saturation: 99 %
O2 Saturation: 100 %
O2 Saturation: 100 %
O2 Saturation: 100 %
PCO2 ART: 58.2 mmHg — AB (ref 32.0–48.0)
PCO2 ART: 50.3 mmHg — AB (ref 32.0–48.0)
PCO2 ART: 56.1 mmHg — AB (ref 32.0–48.0)
PH ART: 7.474 — AB (ref 7.350–7.450)
PH ART: 7.464 — AB (ref 7.350–7.450)
PH ART: 7.458 — AB (ref 7.350–7.450)
PH ART: 7.427 (ref 7.350–7.450)
PO2 ART: 133 mmHg — AB (ref 83.0–108.0)
PO2 ART: 384 mmHg — AB (ref 83.0–108.0)
PO2 ART: 63 mmHg — AB (ref 83.0–108.0)
Patient temperature: 37.1
Patient temperature: 38
Patient temperature: 35
Patient temperature: 35.8
TCO2: 33 mmol/L — ABNORMAL HIGH (ref 22–32)
TCO2: 36 mmol/L — ABNORMAL HIGH (ref 22–32)
TCO2: 43 mmol/L — AB (ref 22–32)
TCO2: 38 mmol/L — AB (ref 22–32)
TCO2: 39 mmol/L — AB (ref 22–32)
pCO2 arterial: 48.1 mmHg — ABNORMAL HIGH (ref 32.0–48.0)
pCO2 arterial: 56.2 mmHg — ABNORMAL HIGH (ref 32.0–48.0)
pH, Arterial: 7.333 — ABNORMAL LOW (ref 7.350–7.450)
pO2, Arterial: 315 mmHg — ABNORMAL HIGH (ref 83.0–108.0)
pO2, Arterial: 299 mmHg — ABNORMAL HIGH (ref 83.0–108.0)

## 2018-01-17 LAB — ABO/RH
ABO/RH(D): O POS
ABO/RH(D): O POS

## 2018-01-17 LAB — CBC
HEMATOCRIT: 28.3 % — AB (ref 39.0–52.0)
HEMOGLOBIN: 8.7 g/dL — AB (ref 13.0–17.0)
MCH: 30.2 pg (ref 26.0–34.0)
MCHC: 30.7 g/dL (ref 30.0–36.0)
MCV: 98.3 fL (ref 78.0–100.0)
Platelets: 138 10*3/uL — ABNORMAL LOW (ref 150–400)
RBC: 2.88 MIL/uL — AB (ref 4.22–5.81)
RDW: 14.9 % (ref 11.5–15.5)
WBC: 10.4 10*3/uL (ref 4.0–10.5)

## 2018-01-17 LAB — CBC WITH DIFFERENTIAL/PLATELET
BASOS PCT: 0 %
Basophils Absolute: 0 10*3/uL (ref 0.0–0.1)
Eosinophils Absolute: 0 10*3/uL (ref 0.0–0.7)
Eosinophils Relative: 0 %
HEMATOCRIT: 32.5 % — AB (ref 39.0–52.0)
HEMOGLOBIN: 11.2 g/dL — AB (ref 13.0–17.0)
LYMPHS ABS: 0.6 10*3/uL — AB (ref 0.7–4.0)
LYMPHS PCT: 9 %
MCH: 30.3 pg (ref 26.0–34.0)
MCHC: 34.5 g/dL (ref 30.0–36.0)
MCV: 87.8 fL (ref 78.0–100.0)
MONOS PCT: 12 %
Monocytes Absolute: 0.8 10*3/uL (ref 0.1–1.0)
NEUTROS ABS: 5 10*3/uL (ref 1.7–7.7)
Neutrophils Relative %: 79 %
Platelets: 98 10*3/uL — ABNORMAL LOW (ref 150–400)
RBC: 3.7 MIL/uL — ABNORMAL LOW (ref 4.22–5.81)
RDW: 14.2 % (ref 11.5–15.5)
WBC Morphology: INCREASED
WBC: 6.4 10*3/uL (ref 4.0–10.5)

## 2018-01-17 LAB — BLOOD GAS, ARTERIAL
ACID-BASE EXCESS: 8.9 mmol/L — AB (ref 0.0–2.0)
BICARBONATE: 33.6 mmol/L — AB (ref 20.0–28.0)
Drawn by: 244871
FIO2: 60
O2 SAT: 98.7 %
PATIENT TEMPERATURE: 98.6
PCO2 ART: 52.8 mmHg — AB (ref 32.0–48.0)
PEEP/CPAP: 5 cmH2O
PH ART: 7.421 (ref 7.350–7.450)
RATE: 24 resp/min
VT: 580 mL
pO2, Arterial: 137 mmHg — ABNORMAL HIGH (ref 83.0–108.0)

## 2018-01-17 LAB — ECHOCARDIOGRAM COMPLETE
Height: 67 in
Weight: 2282.2 oz

## 2018-01-17 LAB — GLUCOSE, CAPILLARY
GLUCOSE-CAPILLARY: 164 mg/dL — AB (ref 65–99)
GLUCOSE-CAPILLARY: 256 mg/dL — AB (ref 65–99)
Glucose-Capillary: 66 mg/dL (ref 65–99)
Glucose-Capillary: 152 mg/dL — ABNORMAL HIGH (ref 65–99)
Glucose-Capillary: 30 mg/dL — CL (ref 65–99)
Glucose-Capillary: 121 mg/dL — ABNORMAL HIGH (ref 65–99)
Glucose-Capillary: 55 mg/dL — ABNORMAL LOW (ref 65–99)
Glucose-Capillary: 84 mg/dL (ref 65–99)
Glucose-Capillary: 106 mg/dL — ABNORMAL HIGH (ref 65–99)

## 2018-01-17 LAB — LACTIC ACID, PLASMA
LACTIC ACID, VENOUS: 2.3 mmol/L — AB (ref 0.5–1.9)
Lactic Acid, Venous: 1.9 mmol/L (ref 0.5–1.9)

## 2018-01-17 LAB — CK TOTAL AND CKMB (NOT AT ARMC)
CK TOTAL: 2899 U/L — AB (ref 49–397)
CK, MB: 46.4 ng/mL — ABNORMAL HIGH (ref 0.5–5.0)
Relative Index: 1.6 (ref 0.0–2.5)

## 2018-01-17 LAB — MAGNESIUM: Magnesium: 1.4 mg/dL — ABNORMAL LOW (ref 1.7–2.4)

## 2018-01-17 LAB — TROPONIN I
TROPONIN I: 1.36 ng/mL — AB (ref <0.03)
TROPONIN I: 2.1 ng/mL — AB (ref <0.03)

## 2018-01-17 LAB — PREPARE RBC (CROSSMATCH)

## 2018-01-17 LAB — APTT: aPTT: 42 seconds — ABNORMAL HIGH (ref 24–36)

## 2018-01-17 LAB — AMYLASE: Amylase: 197 U/L — ABNORMAL HIGH (ref 28–100)

## 2018-01-17 LAB — LACTATE DEHYDROGENASE: LDH: 964 U/L — AB (ref 98–192)

## 2018-01-17 LAB — PHOSPHORUS: Phosphorus: 2.9 mg/dL (ref 2.5–4.6)

## 2018-01-17 LAB — HEMOGLOBIN A1C
Hgb A1c MFr Bld: 5.4 % (ref 4.8–5.6)
MEAN PLASMA GLUCOSE: 108.28 mg/dL

## 2018-01-17 LAB — PROTIME-INR
INR: 1.84
Prothrombin Time: 21.1 seconds — ABNORMAL HIGH (ref 11.4–15.2)

## 2018-01-17 LAB — HIV ANTIBODY (ROUTINE TESTING W REFLEX): HIV Screen 4th Generation wRfx: NONREACTIVE

## 2018-01-17 LAB — BILIRUBIN, DIRECT: BILIRUBIN DIRECT: 0.2 mg/dL (ref 0.1–0.5)

## 2018-01-17 LAB — GAMMA GT: GGT: 68 U/L — ABNORMAL HIGH (ref 7–50)

## 2018-01-17 LAB — LIPASE, BLOOD: LIPASE: 71 U/L — AB (ref 11–51)

## 2018-01-17 LAB — AMMONIA: Ammonia: 34 umol/L (ref 9–35)

## 2018-01-17 MED ORDER — POTASSIUM CHLORIDE 10 MEQ/50ML IV SOLN
10.0000 meq | INTRAVENOUS | Status: AC
  Administered 2018-01-17 (×4): 10 meq via INTRAVENOUS
  Filled 2018-01-17 (×4): qty 50

## 2018-01-17 MED ORDER — SODIUM CHLORIDE 0.9 % IV SOLN
INTRAVENOUS | Status: DC
  Administered 2018-01-17: 18:00:00 via INTRAVENOUS

## 2018-01-17 MED ORDER — DEXTROSE 50 % IV SOLN
INTRAVENOUS | Status: AC
  Administered 2018-01-17: 20 mL
  Filled 2018-01-17: qty 50

## 2018-01-17 MED ORDER — CHLORHEXIDINE GLUCONATE CLOTH 2 % EX PADS
6.0000 | MEDICATED_PAD | Freq: Every day | CUTANEOUS | Status: DC
  Administered 2018-01-17 – 2018-01-18 (×2): 6 via TOPICAL

## 2018-01-17 MED ORDER — LEVOTHYROXINE SODIUM 100 MCG IV SOLR
20.0000 ug | Freq: Once | INTRAVENOUS | Status: AC
  Administered 2018-01-17: 20 ug via INTRAVENOUS
  Filled 2018-01-17: qty 5

## 2018-01-17 MED ORDER — SODIUM CHLORIDE 0.9 % IV SOLN
2.0000 g | Freq: Once | INTRAVENOUS | Status: AC
  Administered 2018-01-17: 2 g via INTRAVENOUS
  Filled 2018-01-17: qty 20

## 2018-01-17 MED ORDER — SODIUM CHLORIDE 0.9 % IV SOLN
INTRAVENOUS | Status: DC
  Administered 2018-01-17: 2 [IU]/h via INTRAVENOUS
  Filled 2018-01-17: qty 1

## 2018-01-17 MED ORDER — SODIUM CHLORIDE 0.9 % IV SOLN
Freq: Once | INTRAVENOUS | Status: AC
  Administered 2018-01-17: 14:00:00 via INTRAVENOUS

## 2018-01-17 MED ORDER — VANCOMYCIN HCL IN DEXTROSE 1-5 GM/200ML-% IV SOLN
1000.0000 mg | Freq: Once | INTRAVENOUS | Status: AC
  Administered 2018-01-17: 1000 mg via INTRAVENOUS
  Filled 2018-01-17: qty 200

## 2018-01-17 MED ORDER — INSULIN ASPART 100 UNIT/ML ~~LOC~~ SOLN
20.0000 [IU] | Freq: Once | SUBCUTANEOUS | Status: AC
  Administered 2018-01-17: 20 [IU] via SUBCUTANEOUS

## 2018-01-17 MED ORDER — SODIUM CHLORIDE 0.9% FLUSH
10.0000 mL | Freq: Two times a day (BID) | INTRAVENOUS | Status: DC
  Administered 2018-01-17: 10 mL

## 2018-01-17 MED ORDER — SODIUM CHLORIDE 0.9% FLUSH: 10.0000 mL | INTRAVENOUS | Status: DC | PRN

## 2018-01-17 MED ORDER — FUROSEMIDE 10 MG/ML IJ SOLN
20.0000 mg | Freq: Once | INTRAMUSCULAR | Status: AC
  Administered 2018-01-17: 20 mg via INTRAVENOUS
  Filled 2018-01-17: qty 2

## 2018-01-17 MED ORDER — SODIUM CHLORIDE 0.9 % IV SOLN
INTRAVENOUS | Status: DC | PRN
  Administered 2018-01-18: 06:00:00 via INTRA_ARTERIAL

## 2018-01-17 MED ORDER — SODIUM CHLORIDE 0.9 % IV SOLN
0.0000 ug/min | INTRAVENOUS | Status: DC
  Administered 2018-01-17: 20 ug/min via INTRAVENOUS
  Administered 2018-01-17: 45 ug/min via INTRAVENOUS
  Administered 2018-01-18: 25 ug/min via INTRAVENOUS
  Filled 2018-01-17 (×4): qty 1

## 2018-01-17 MED ORDER — NOREPINEPHRINE BITARTRATE 1 MG/ML IV SOLN
0.0000 ug/min | INTRAVENOUS | Status: DC
  Filled 2018-01-17: qty 16

## 2018-01-17 MED ORDER — ALBUTEROL SULFATE (2.5 MG/3ML) 0.083% IN NEBU
2.5000 mg | INHALATION_SOLUTION | RESPIRATORY_TRACT | Status: DC | PRN
  Administered 2018-01-17: 2.5 mg via RESPIRATORY_TRACT
  Filled 2018-01-17: qty 3

## 2018-01-17 MED ORDER — KCL IN DEXTROSE-NACL 20-5-0.9 MEQ/L-%-% IV SOLN
INTRAVENOUS | Status: DC
  Administered 2018-01-17 – 2018-01-18 (×3): via INTRAVENOUS
  Administered 2018-01-18: 125 mL via INTRAVENOUS
  Filled 2018-01-17 (×5): qty 1000

## 2018-01-17 MED ORDER — SODIUM CHLORIDE 0.9 % IV SOLN: 2.0000 g | INTRAVENOUS | Status: DC

## 2018-01-17 MED ORDER — DEXTROSE 50 % IV SOLN
1.0000 | Freq: Once | INTRAVENOUS | Status: AC
  Administered 2018-01-17: 50 mL via INTRAVENOUS
  Filled 2018-01-17: qty 50

## 2018-01-17 MED ORDER — SODIUM CHLORIDE 0.9 % IV SOLN
10.0000 ug/h | INTRAVENOUS | Status: DC
  Administered 2018-01-17: 10 ug/h via INTRAVENOUS
  Filled 2018-01-17 (×2): qty 10

## 2018-01-17 MED ORDER — SODIUM CHLORIDE 0.9 % IV SOLN
2000.0000 mg | Freq: Once | INTRAVENOUS | Status: AC
  Administered 2018-01-17: 2000 mg via INTRAVENOUS
  Filled 2018-01-17: qty 16

## 2018-01-17 MED ORDER — DEXTROSE 50 % IV SOLN
INTRAVENOUS | Status: AC
  Administered 2018-01-17: 50 mL
  Filled 2018-01-17: qty 50

## 2018-01-17 MED ORDER — DEXTROSE 50 % IV SOLN
1.0000 | Freq: Once | INTRAVENOUS | Status: AC
  Administered 2018-01-17: 50 mL via INTRAVENOUS

## 2018-01-17 MED ORDER — NOREPINEPHRINE BITARTRATE 1 MG/ML IV SOLN
0.0000 ug/min | INTRAVENOUS | Status: DC
  Administered 2018-01-17: 5 ug/min via INTRAVENOUS
  Administered 2018-01-17: 18 ug/min via INTRAVENOUS
  Administered 2018-01-17: 30 ug/min via INTRAVENOUS
  Administered 2018-01-17: 33 ug/min via INTRAVENOUS
  Administered 2018-01-17: 20 ug/min via INTRAVENOUS
  Administered 2018-01-17: 32 ug/min via INTRAVENOUS
  Filled 2018-01-17 (×7): qty 4

## 2018-01-17 NOTE — Progress Notes (Signed)
Performed apnea test.  CO2 increased from 47 to >91 with no respiratory effort or any changes in vitals other than hypertension which was due to changes made in neo drip.  No change in HR.  Given neuro exam, CT findings and apnea test I am confident patient is brain death.  Time of death 9:43 AM on 01/23/2018.  The patient is critically ill with multiple organ systems failure and requires high complexity decision making for assessment and support, frequent evaluation and titration of therapies, application of advanced monitoring technologies and extensive interpretation of multiple databases.   Critical Care Time devoted to patient care services described in this note is  45  Minutes. This time reflects time of care of this signee Dr Koren BoundWesam Demetreus Lothamer. This critical care time does not reflect procedure time, or teaching time or supervisory time of PA/NP/Med student/Med Resident etc but could involve care discussion time.  Alyson ReedyWesam G. Oceane Fosse, M.D. [facility] Pulmonary/Critical Care Medicine. Pager: [phone]. After hours pager: [phone].

## 2018-01-17 NOTE — Procedures (Signed)
Central Venous Catheter Insertion Procedure Note Joshua HopesCarl Linwood Colon [id number] 08/26/1976  Procedure: Insertion of Central Venous Catheter Indications: Assessment of intravascular volume, Drug and/or fluid administration and Frequent blood sampling  Procedure Details Consent: Unable to obtain consent because of altered level of consciousness. Time Out: Verified patient identification, verified procedure, site/side was marked, verified correct patient position, special equipment/implants available, medications/allergies/relevent history reviewed, required imaging and test results available.  Performed  Maximum sterile technique was used including antiseptics, cap, gloves, gown, hand hygiene, mask and sheet. Skin prep: Chlorhexidine; local anesthetic administered A antimicrobial bonded/coated triple lumen catheter was placed in the left subclavian vein using the Seldinger technique.  Evaluation Blood flow good Complications: No apparent complications Patient did tolerate procedure well. Chest X-ray ordered to verify placement.  CXR: pending.  U/S used in placement.  Joshua Colon 02/13/2018, 12:33 PM

## 2018-01-17 NOTE — Procedures (Signed)
Arterial Catheter Insertion Procedure Note Joshua HopesCarl Linwood Kudrna [id number] 09/19/1976  Procedure: Insertion of Arterial Catheter  Indications: Blood pressure monitoring and Frequent blood sampling  Procedure Details Consent: Unable to obtain consent because of emergent medical necessity. Time Out: Verified patient identification, verified procedure, site/side was marked, verified correct patient position, special equipment/implants available, medications/allergies/relevent history reviewed, required imaging and test results available.  Performed  Maximum sterile technique was used including antiseptics, cap, gloves, gown, hand hygiene, mask and sheet. Skin prep: Chlorhexidine; local anesthetic administered 20 gauge catheter was inserted into right femoral artery using the Seldinger technique.  Evaluation Blood flow good; BP tracing good. Complications: No apparent complications.   Koren BoundYACOUB,Binta Statzer 01/31/2018

## 2018-01-17 NOTE — Progress Notes (Signed)
CRITICAL VALUE ALERT  Critical Value:  Troponin 2.1 Date & Time Notied: 01/22/2018 0100 Provider Notified: Dr Denese KillingsAgarwala  Orders Received/Actions taken: none

## 2018-01-17 NOTE — H&P (View-Only) (Signed)
PULMONARY / CRITICAL CARE MEDICINE   Name: Joshua Colon MRN: [id number] DOB: 1976-11-10    ADMISSION DATE:  01-23-2018  CHIEF COMPLAINT: Out of hospital cardiac arrest.  HISTORY OF PRESENT ILLNESS:       The patient was found unresponsive in the field with an unknown downtime.  He was apneic and pulseless when EMS arrived.  CPR was initiated he was given 1 mg of epinephrine followed by several small boluses and return of circulation was established.  He was reintubated in the department of emergency medicine and did not require any drugs for intubation.  Family has left apparently they reported a history of drug abuse before departing.  SUBJECTIVE:  No events overnight, off sedation, completely unresponsive  VITAL SIGNS: BP (!) 126/102   Pulse (!) 130   Temp (!) 101.1 F (38.4 C)   Resp (!) 30   Ht 5\' 7"  (1.702 m)   Wt 142 lb 10.2 oz (64.7 kg)   SpO2 99%   BMI 22.34 kg/m   HEMODYNAMICS:    VENTILATOR SETTINGS: Vent Mode: PRVC FiO2 (%):  [50 %-100 %] 50 % Set Rate:  [16 bmp-30 bmp] 30 bmp Vt Set:  [580 mL-600 mL] 580 mL PEEP:  [5 cmH20] 5 cmH20 Plateau Pressure:  [21 cmH20-22 cmH20] 21 cmH20  INTAKE / OUTPUT: I/O last 3 completed shifts: In: 8395.2 [I.V.:6395.2; IV Piggyback:2000] Out: 1252 [Urine:350; Emesis/NG output:400; Stool:502]  PHYSICAL EXAMINATION: General: This is a thin male who appears his stated age who is orally intubated and mechanically ventilated Neuro: No gag, dolls eye, pupillary or corneal reflexes.  No respiratory drive. Cardiovascular: S1 and S2 are regular without murmur rub or gallop. Lungs: CTA bilaterally Abdomen: The abdomen is flat and soft without any organomegaly masses or tenderness.  He is anicteric. Skin: No track marks or edema extremities warm  LABS:  BMET Recent Labs  Lab 01/23/18 1600 2018/01/23 1900  NA 139  --   K 5.5*  --   CL 106  --   CO2 16*  --   BUN 18  --   CREATININE 1.58* 1.52*  GLUCOSE 129*  --      Electrolytes Recent Labs  Lab 2018-01-23 1600 January 23, 2018 1720 01/16/2018 0343  CALCIUM 8.3*  --   --   MG  --  2.8* 1.4*  PHOS  --   --  2.9    CBC Recent Labs  Lab 01/23/2018 1600 2018-01-23 1900 01/30/2018 0343  WBC 24.8* 20.0* 10.4  HGB 12.3* 14.2 8.7*  HCT 39.4 42.7 28.3*  PLT 146* 207 138*    Coag's No results for input(s): APTT, INR in the last 168 hours.  Sepsis Markers Recent Labs  Lab 01-23-2018 1607  LATICACIDVEN 9.94*    ABG Recent Labs  Lab 23-Jan-2018 2212 02/02/2018 0407  PHART 7.172* 7.333*  PCO2ART 77.0* 58.2*  PO2ART 419.0* 133.0*    Liver Enzymes Recent Labs  Lab Jan 23, 2018 1600  AST 446*  ALT 271*  ALKPHOS 125  BILITOT 0.8  ALBUMIN 3.2*    Cardiac Enzymes Recent Labs  Lab Jan 23, 2018 1720 01-23-2018 2343  TROPONINI 0.24* 2.10*    Glucose No results for input(s): GLUCAP in the last 168 hours.  Imaging Ct Head Wo Contrast  Result Date: 01-23-18 CLINICAL DATA:  42 year old male found down in his yard. Apneic and pulseless upon arrival. Pupils are dilated and nonreactive. EXAM: CT HEAD WITHOUT CONTRAST CT CERVICAL SPINE WITHOUT CONTRAST TECHNIQUE: Multidetector CT imaging of the head and cervical  spine was performed following the standard protocol without intravenous contrast. Multiplanar CT image reconstructions of the cervical spine were also generated. COMPARISON:  None. FINDINGS: CT HEAD FINDINGS: Brain: Differentiation mild diffuse loss. Diffuse of gray-white effacement of the cortical sulci. Effacement of the basal cisterns. All these findings suggest diffuse brain edema, likely from anoxic injury. No acute intracranial hemorrhage, hydrocephalus or mass lesion. Vascular: No hyperdense vessel or unexpected calcification. Skull: Normal. Negative for fracture or focal lesion. Sinuses/Orbits: No acute finding. Other: None. CT CERVICAL SPINE FINDINGS Alignment: Normal. Skull base and vertebrae: No acute fracture. No primary bone lesion or focal  pathologic process. Soft tissues and spinal canal: No prevertebral fluid or swelling. No visible canal hematoma. Disc levels:  No significant degenerative disc disease. Upper chest: Unremarkable. Other: Intubated patient. Nasogastric tube is mildly coiled in the oropharynx. IMPRESSION: 1. Appearance of the brain suggests diffuse cerebral and cerebellar edema related to recent anoxic brain injury, as above. 2. No acute abnormality of the cervical spine. Critical Value/emergent results were called by telephone at the time of interpretation on 01/29/2018 at 5:23 pm to Dr. Cathren LaineKEVIN STEINL, who verbally acknowledged these results. Electronically Signed   By: Trudie Reedaniel  Entrikin M.D.   On: 02/08/2018 17:24   Ct Cervical Spine Wo Contrast  Result Date: 02/01/2018 CLINICAL DATA:  42 year old male found down in his yard. Apneic and pulseless upon arrival. Pupils are dilated and nonreactive. EXAM: CT HEAD WITHOUT CONTRAST CT CERVICAL SPINE WITHOUT CONTRAST TECHNIQUE: Multidetector CT imaging of the head and cervical spine was performed following the standard protocol without intravenous contrast. Multiplanar CT image reconstructions of the cervical spine were also generated. COMPARISON:  None. FINDINGS: CT HEAD FINDINGS: Brain: Differentiation mild diffuse loss. Diffuse of gray-white effacement of the cortical sulci. Effacement of the basal cisterns. All these findings suggest diffuse brain edema, likely from anoxic injury. No acute intracranial hemorrhage, hydrocephalus or mass lesion. Vascular: No hyperdense vessel or unexpected calcification. Skull: Normal. Negative for fracture or focal lesion. Sinuses/Orbits: No acute finding. Other: None. CT CERVICAL SPINE FINDINGS Alignment: Normal. Skull base and vertebrae: No acute fracture. No primary bone lesion or focal pathologic process. Soft tissues and spinal canal: No prevertebral fluid or swelling. No visible canal hematoma. Disc levels:  No significant degenerative disc disease.  Upper chest: Unremarkable. Other: Intubated patient. Nasogastric tube is mildly coiled in the oropharynx. IMPRESSION: 1. Appearance of the brain suggests diffuse cerebral and cerebellar edema related to recent anoxic brain injury, as above. 2. No acute abnormality of the cervical spine. Critical Value/emergent results were called by telephone at the time of interpretation on 01/29/2018 at 5:23 pm to Dr. Cathren LaineKEVIN STEINL, who verbally acknowledged these results. Electronically Signed   By: Trudie Reedaniel  Entrikin M.D.   On: 01/29/2018 17:24   Ct Abdomen Pelvis W Contrast  Result Date: 02/06/2018 CLINICAL DATA:  42 year old male found down in his yard apneic and pulseless. Pupils are dilated and nonreactive. EXAM: CT ABDOMEN AND PELVIS WITH CONTRAST TECHNIQUE: Multidetector CT imaging of the abdomen and pelvis was performed using the standard protocol following bolus administration of intravenous contrast. CONTRAST:  100mL ISOVUE-300 IOPAMIDOL (ISOVUE-300) INJECTION 61% COMPARISON:  None. FINDINGS: Lower chest: Areas of dependent atelectasis are noted in the lower lobes of the lungs bilaterally. Hepatobiliary: No cystic or solid hepatic lesions. No intra or extrahepatic biliary ductal dilatation. Gallbladder is nearly completely decompressed, but otherwise unremarkable in appearance. Pancreas: No pancreatic mass. No pancreatic ductal dilatation. No pancreatic or peripancreatic fluid or inflammatory changes. Spleen:  Unremarkable. Adrenals/Urinary Tract: Bilateral kidneys and bilateral adrenal glands are normal in appearance. No hydroureteronephrosis. Urinary bladder is normal in appearance. Foley balloon catheter with tip in the lumen of the urinary bladder. Small amount of gas non dependently in the lumen of the urinary bladder, presumably iatrogenic. Stomach/Bowel: Nasogastric tube terminating in the body of the stomach. Stomach is mildly distended, but otherwise unremarkable in appearance. No pathologic dilatation of small  bowel or colon. Normal appendix. Vascular/Lymphatic: Aortic atherosclerosis, without evidence of aneurysm or dissection in the abdominal or pelvic vasculature. No lymphadenopathy noted in the abdomen or pelvis. Reproductive: Prostate gland and seminal vesicles are unremarkable in appearance. Other: No significant volume of ascites.  No pneumoperitoneum. Musculoskeletal: No acute displaced fractures or aggressive appearing lytic or blastic lesions are noted in the visualized portions of the skeleton. IMPRESSION: 1. No acute findings are noted in the abdomen or pelvis. 2. Bibasilar subsegmental atelectasis in the lower lobes of the lungs bilaterally. 3. Aortic atherosclerosis. Aortic Atherosclerosis (ICD10-I70.0). Electronically Signed   By: Trudie Reed M.D.   On: 01/30/2018 17:28   Dg Chest Portable 1 View  Result Date: 01/31/2018 CLINICAL DATA:  Status post CPR EXAM: PORTABLE CHEST 1 VIEW COMPARISON:  None. FINDINGS: Endotracheal tube with the tip 4.9 cm above the carina. There is no focal parenchymal opacity. There is no pleural effusion or pneumothorax. The heart and mediastinal contours are unremarkable. The osseous structures are unremarkable. IMPRESSION: Endotracheal tube with the tip 4.9 cm above the carina. Electronically Signed   By: Elige Ko   On: 02/13/2018 16:19   Dg Abd Portable 1 View  Result Date: 01/20/2018 CLINICAL DATA:  Orogastric tube placement. EXAM: PORTABLE ABDOMEN - 1 VIEW COMPARISON:  None. FINDINGS: Orogastric tube tip passes below the diaphragm. Tip lies in the proximal to mid stomach. Side hole lies at the level of the gastroesophageal junction. IMPRESSION: Orogastric tube tip within the proximal to mid stomach. Electronically Signed   By: Amie Portland M.D.   On: 01/15/2018 16:14     STUDIES:  CT scan of the head to my eye shows diffuse cerebral edema with a most complete obliteration of the sulci.  I am awaiting  radiology's interpretation.   SIGNIFICANT  EVENTS:   DISCUSSION: Out of hospital cardiac arrest in a known drug abuser that is positive for methamphetamine, unknown downtime, arrives with diffuse cerebral edema already.  Exam this AM consistent with brain death.  Will place a-line for ABG, perform apnea test.  Spoke with CDS, they will wait for patient to be declared first prior to approaching family then will need to decide where to go from there regarding further procedures.  The patient is critically ill with multiple organ systems failure and requires high complexity decision making for assessment and support, frequent evaluation and titration of therapies, application of advanced monitoring technologies and extensive interpretation of multiple databases.   Critical Care Time devoted to patient care services described in this note is  45  Minutes. This time reflects time of care of this signee Dr Koren Bound. This critical care time does not reflect procedure time, or teaching time or supervisory time of PA/NP/Med student/Med Resident etc but could involve care discussion time.  Alyson Reedy, M.D. [facility] Pulmonary/Critical Care Medicine. Pager: [phone]. After hours pager: [phone].

## 2018-01-17 NOTE — Progress Notes (Signed)
RT note- Recruitment maneuver performed 

## 2018-01-17 NOTE — Procedures (Signed)
Bronchoscopy Procedure Note Joshua Colon Study [id number] 10/22/1976  Procedure: Bronchoscopy Indications: Diagnostic evaluation of the airways, Obtain specimens for culture and/or other diagnostic studies and Remove secretions  Procedure Details Consent: Risks of procedure as well as the alternatives and risks of each were explained to the (patient/caregiver).  Consent for procedure obtained. Time Out: Verified patient identification, verified procedure, site/side was marked, verified correct patient position, special equipment/implants available, medications/allergies/relevent history reviewed, required imaging and test results available.  Performed  In preparation for procedure, patient was given 100% FiO2 and bronchoscope lubricated. Sedation: None, patient is brain dead  Airway entered and the following bronchi were examined: RUL, RML, RLL, LUL, LLL and Bronchi.   Diffuse purulence but more at the RLL with blood secretion and inflamed airways diffusely. Bronchoscope removed.  , Patient placed back on 100% FiO2 at conclusion of procedure.    Evaluation Hemodynamic Status: BP stable throughout; O2 sats: stable throughout Patient's Current Condition: stable Specimens:  Sent purulent fluid Complications: No apparent complications Patient did tolerate procedure well.   Joshua BoundYACOUB,Joshua Colon 02/08/2018

## 2018-01-17 NOTE — Progress Notes (Signed)
PULMONARY / CRITICAL CARE MEDICINE   Name: Joshua Colon MRN: [id number] DOB: 1976-11-10    ADMISSION DATE:  01-23-2018  CHIEF COMPLAINT: Out of hospital cardiac arrest.  HISTORY OF PRESENT ILLNESS:       The patient was found unresponsive in the field with an unknown downtime.  He was apneic and pulseless when EMS arrived.  CPR was initiated he was given 1 mg of epinephrine followed by several small boluses and return of circulation was established.  He was reintubated in the department of emergency medicine and did not require any drugs for intubation.  Family has left apparently they reported a history of drug abuse before departing.  SUBJECTIVE:  No events overnight, off sedation, completely unresponsive  VITAL SIGNS: BP (!) 126/102   Pulse (!) 130   Temp (!) 101.1 F (38.4 C)   Resp (!) 30   Ht 5\' 7"  (1.702 m)   Wt 142 lb 10.2 oz (64.7 kg)   SpO2 99%   BMI 22.34 kg/m   HEMODYNAMICS:    VENTILATOR SETTINGS: Vent Mode: PRVC FiO2 (%):  [50 %-100 %] 50 % Set Rate:  [16 bmp-30 bmp] 30 bmp Vt Set:  [580 mL-600 mL] 580 mL PEEP:  [5 cmH20] 5 cmH20 Plateau Pressure:  [21 cmH20-22 cmH20] 21 cmH20  INTAKE / OUTPUT: I/O last 3 completed shifts: In: 8395.2 [I.V.:6395.2; IV Piggyback:2000] Out: 1252 [Urine:350; Emesis/NG output:400; Stool:502]  PHYSICAL EXAMINATION: General: This is a thin male who appears his stated age who is orally intubated and mechanically ventilated Neuro: No gag, dolls eye, pupillary or corneal reflexes.  No respiratory drive. Cardiovascular: S1 and S2 are regular without murmur rub or gallop. Lungs: CTA bilaterally Abdomen: The abdomen is flat and soft without any organomegaly masses or tenderness.  He is anicteric. Skin: No track marks or edema extremities warm  LABS:  BMET Recent Labs  Lab 01/23/18 1600 2018/01/23 1900  NA 139  --   K 5.5*  --   CL 106  --   CO2 16*  --   BUN 18  --   CREATININE 1.58* 1.52*  GLUCOSE 129*  --      Electrolytes Recent Labs  Lab 2018-01-23 1600 January 23, 2018 1720 01/24/2018 0343  CALCIUM 8.3*  --   --   MG  --  2.8* 1.4*  PHOS  --   --  2.9    CBC Recent Labs  Lab 01/23/2018 1600 2018-01-23 1900 01/28/2018 0343  WBC 24.8* 20.0* 10.4  HGB 12.3* 14.2 8.7*  HCT 39.4 42.7 28.3*  PLT 146* 207 138*    Coag's No results for input(s): APTT, INR in the last 168 hours.  Sepsis Markers Recent Labs  Lab 01-23-2018 1607  LATICACIDVEN 9.94*    ABG Recent Labs  Lab 23-Jan-2018 2212 02/13/2018 0407  PHART 7.172* 7.333*  PCO2ART 77.0* 58.2*  PO2ART 419.0* 133.0*    Liver Enzymes Recent Labs  Lab Jan 23, 2018 1600  AST 446*  ALT 271*  ALKPHOS 125  BILITOT 0.8  ALBUMIN 3.2*    Cardiac Enzymes Recent Labs  Lab Jan 23, 2018 1720 01-23-2018 2343  TROPONINI 0.24* 2.10*    Glucose No results for input(s): GLUCAP in the last 168 hours.  Imaging Ct Head Wo Contrast  Result Date: 01-23-18 CLINICAL DATA:  42 year old male found down in his yard. Apneic and pulseless upon arrival. Pupils are dilated and nonreactive. EXAM: CT HEAD WITHOUT CONTRAST CT CERVICAL SPINE WITHOUT CONTRAST TECHNIQUE: Multidetector CT imaging of the head and cervical  spine was performed following the standard protocol without intravenous contrast. Multiplanar CT image reconstructions of the cervical spine were also generated. COMPARISON:  None. FINDINGS: CT HEAD FINDINGS: Brain: Differentiation mild diffuse loss. Diffuse of gray-white effacement of the cortical sulci. Effacement of the basal cisterns. All these findings suggest diffuse brain edema, likely from anoxic injury. No acute intracranial hemorrhage, hydrocephalus or mass lesion. Vascular: No hyperdense vessel or unexpected calcification. Skull: Normal. Negative for fracture or focal lesion. Sinuses/Orbits: No acute finding. Other: None. CT CERVICAL SPINE FINDINGS Alignment: Normal. Skull base and vertebrae: No acute fracture. No primary bone lesion or focal  pathologic process. Soft tissues and spinal canal: No prevertebral fluid or swelling. No visible canal hematoma. Disc levels:  No significant degenerative disc disease. Upper chest: Unremarkable. Other: Intubated patient. Nasogastric tube is mildly coiled in the oropharynx. IMPRESSION: 1. Appearance of the brain suggests diffuse cerebral and cerebellar edema related to recent anoxic brain injury, as above. 2. No acute abnormality of the cervical spine. Critical Value/emergent results were called by telephone at the time of interpretation on 01/29/2018 at 5:23 pm to Dr. Cathren LaineKEVIN STEINL, who verbally acknowledged these results. Electronically Signed   By: Trudie Reedaniel  Entrikin M.D.   On: 02/08/2018 17:24   Ct Cervical Spine Wo Contrast  Result Date: 02/01/2018 CLINICAL DATA:  42 year old male found down in his yard. Apneic and pulseless upon arrival. Pupils are dilated and nonreactive. EXAM: CT HEAD WITHOUT CONTRAST CT CERVICAL SPINE WITHOUT CONTRAST TECHNIQUE: Multidetector CT imaging of the head and cervical spine was performed following the standard protocol without intravenous contrast. Multiplanar CT image reconstructions of the cervical spine were also generated. COMPARISON:  None. FINDINGS: CT HEAD FINDINGS: Brain: Differentiation mild diffuse loss. Diffuse of gray-white effacement of the cortical sulci. Effacement of the basal cisterns. All these findings suggest diffuse brain edema, likely from anoxic injury. No acute intracranial hemorrhage, hydrocephalus or mass lesion. Vascular: No hyperdense vessel or unexpected calcification. Skull: Normal. Negative for fracture or focal lesion. Sinuses/Orbits: No acute finding. Other: None. CT CERVICAL SPINE FINDINGS Alignment: Normal. Skull base and vertebrae: No acute fracture. No primary bone lesion or focal pathologic process. Soft tissues and spinal canal: No prevertebral fluid or swelling. No visible canal hematoma. Disc levels:  No significant degenerative disc disease.  Upper chest: Unremarkable. Other: Intubated patient. Nasogastric tube is mildly coiled in the oropharynx. IMPRESSION: 1. Appearance of the brain suggests diffuse cerebral and cerebellar edema related to recent anoxic brain injury, as above. 2. No acute abnormality of the cervical spine. Critical Value/emergent results were called by telephone at the time of interpretation on 01/29/2018 at 5:23 pm to Dr. Cathren LaineKEVIN STEINL, who verbally acknowledged these results. Electronically Signed   By: Trudie Reedaniel  Entrikin M.D.   On: 01/29/2018 17:24   Ct Abdomen Pelvis W Contrast  Result Date: 02/06/2018 CLINICAL DATA:  42 year old male found down in his yard apneic and pulseless. Pupils are dilated and nonreactive. EXAM: CT ABDOMEN AND PELVIS WITH CONTRAST TECHNIQUE: Multidetector CT imaging of the abdomen and pelvis was performed using the standard protocol following bolus administration of intravenous contrast. CONTRAST:  100mL ISOVUE-300 IOPAMIDOL (ISOVUE-300) INJECTION 61% COMPARISON:  None. FINDINGS: Lower chest: Areas of dependent atelectasis are noted in the lower lobes of the lungs bilaterally. Hepatobiliary: No cystic or solid hepatic lesions. No intra or extrahepatic biliary ductal dilatation. Gallbladder is nearly completely decompressed, but otherwise unremarkable in appearance. Pancreas: No pancreatic mass. No pancreatic ductal dilatation. No pancreatic or peripancreatic fluid or inflammatory changes. Spleen:  Unremarkable. Adrenals/Urinary Tract: Bilateral kidneys and bilateral adrenal glands are normal in appearance. No hydroureteronephrosis. Urinary bladder is normal in appearance. Foley balloon catheter with tip in the lumen of the urinary bladder. Small amount of gas non dependently in the lumen of the urinary bladder, presumably iatrogenic. Stomach/Bowel: Nasogastric tube terminating in the body of the stomach. Stomach is mildly distended, but otherwise unremarkable in appearance. No pathologic dilatation of small  bowel or colon. Normal appendix. Vascular/Lymphatic: Aortic atherosclerosis, without evidence of aneurysm or dissection in the abdominal or pelvic vasculature. No lymphadenopathy noted in the abdomen or pelvis. Reproductive: Prostate gland and seminal vesicles are unremarkable in appearance. Other: No significant volume of ascites.  No pneumoperitoneum. Musculoskeletal: No acute displaced fractures or aggressive appearing lytic or blastic lesions are noted in the visualized portions of the skeleton. IMPRESSION: 1. No acute findings are noted in the abdomen or pelvis. 2. Bibasilar subsegmental atelectasis in the lower lobes of the lungs bilaterally. 3. Aortic atherosclerosis. Aortic Atherosclerosis (ICD10-I70.0). Electronically Signed   By: Trudie Reed M.D.   On: 01/15/2018 17:28   Dg Chest Portable 1 View  Result Date: 02/10/2018 CLINICAL DATA:  Status post CPR EXAM: PORTABLE CHEST 1 VIEW COMPARISON:  None. FINDINGS: Endotracheal tube with the tip 4.9 cm above the carina. There is no focal parenchymal opacity. There is no pleural effusion or pneumothorax. The heart and mediastinal contours are unremarkable. The osseous structures are unremarkable. IMPRESSION: Endotracheal tube with the tip 4.9 cm above the carina. Electronically Signed   By: Elige Ko   On: 01/21/2018 16:19   Dg Abd Portable 1 View  Result Date: 01/30/2018 CLINICAL DATA:  Orogastric tube placement. EXAM: PORTABLE ABDOMEN - 1 VIEW COMPARISON:  None. FINDINGS: Orogastric tube tip passes below the diaphragm. Tip lies in the proximal to mid stomach. Side hole lies at the level of the gastroesophageal junction. IMPRESSION: Orogastric tube tip within the proximal to mid stomach. Electronically Signed   By: Amie Portland M.D.   On: 02/13/2018 16:14     STUDIES:  CT scan of the head to my eye shows diffuse cerebral edema with a most complete obliteration of the sulci.  I am awaiting  radiology's interpretation.   SIGNIFICANT  EVENTS:   DISCUSSION: Out of hospital cardiac arrest in a known drug abuser that is positive for methamphetamine, unknown downtime, arrives with diffuse cerebral edema already.  Exam this AM consistent with brain death.  Will place a-line for ABG, perform apnea test.  Spoke with CDS, they will wait for patient to be declared first prior to approaching family then will need to decide where to go from there regarding further procedures.  The patient is critically ill with multiple organ systems failure and requires high complexity decision making for assessment and support, frequent evaluation and titration of therapies, application of advanced monitoring technologies and extensive interpretation of multiple databases.   Critical Care Time devoted to patient care services described in this note is  45  Minutes. This time reflects time of care of this signee Dr Koren Bound. This critical care time does not reflect procedure time, or teaching time or supervisory time of PA/NP/Med student/Med Resident etc but could involve care discussion time.  Alyson Reedy, M.D. [facility] Pulmonary/Critical Care Medicine. Pager: [phone]. After hours pager: [phone].

## 2018-01-17 NOTE — Progress Notes (Signed)
Pt recruitment done per Donor Services, Albuterol neb given and pt suctioned with no return.

## 2018-01-18 ENCOUNTER — Inpatient Hospital Stay (HOSPITAL_COMMUNITY): Payer: Medicaid Other | Admitting: Anesthesiology

## 2018-01-18 ENCOUNTER — Encounter (HOSPITAL_COMMUNITY): Admission: EM | Disposition: E | Payer: Self-pay | Source: Home / Self Care | Attending: Pulmonary Disease

## 2018-01-18 ENCOUNTER — Inpatient Hospital Stay (HOSPITAL_COMMUNITY): Payer: Medicaid Other

## 2018-01-18 ENCOUNTER — Encounter (HOSPITAL_COMMUNITY): Payer: Self-pay | Admitting: Cardiology

## 2018-01-18 HISTORY — PX: VIDEO BRONCHOSCOPY: SHX5072

## 2018-01-18 HISTORY — PX: ORGAN PROCUREMENT: SHX5270

## 2018-01-18 HISTORY — PX: ULTRASOUND GUIDANCE FOR VASCULAR ACCESS: SHX6516

## 2018-01-18 HISTORY — PX: RIGHT/LEFT HEART CATH AND CORONARY ANGIOGRAPHY: CATH118266

## 2018-01-18 LAB — MAGNESIUM: Magnesium: 1.6 mg/dL — ABNORMAL LOW (ref 1.7–2.4)

## 2018-01-18 LAB — BPAM RBC
BLOOD PRODUCT EXPIRATION DATE: 201903272359
Blood Product Expiration Date: 201903272359
ISSUE DATE / TIME: 201903031657
ISSUE DATE / TIME: 201903031339
Unit Type and Rh: 5100
Unit Type and Rh: 5100

## 2018-01-18 LAB — BLOOD GAS, ARTERIAL
Acid-base deficit: 16.4 mmol/L — ABNORMAL HIGH (ref 0.0–2.0)
Bicarbonate: 15.7 mmol/L — ABNORMAL LOW (ref 20.0–28.0)
FIO2: 100
MECHVT: 600 mL
O2 Saturation: 95.7 %
PEEP/CPAP: 5 cmH2O
PO2 ART: 162 mmHg — AB (ref 83.0–108.0)
Patient temperature: 95.9
RATE: 16 resp/min
pCO2 arterial: 100 mmHg (ref 32.0–48.0)
pH, Arterial: 6.828 — CL (ref 7.350–7.450)

## 2018-01-18 LAB — POCT I-STAT 3, VENOUS BLOOD GAS (G3P V)
ACID-BASE EXCESS: 4 mmol/L — AB (ref 0.0–2.0)
Acid-Base Excess: 5 mmol/L — ABNORMAL HIGH (ref 0.0–2.0)
BICARBONATE: 30.1 mmol/L — AB (ref 20.0–28.0)
BICARBONATE: 32 mmol/L — AB (ref 20.0–28.0)
O2 SAT: 89 %
O2 Saturation: 87 %
PCO2 VEN: 58 mmHg (ref 44.0–60.0)
PH VEN: 7.35 (ref 7.250–7.430)
PO2 VEN: 58 mmHg — AB (ref 32.0–45.0)
TCO2: 32 mmol/L (ref 22–32)
TCO2: 34 mmol/L — AB (ref 22–32)
pCO2, Ven: 53.6 mmHg (ref 44.0–60.0)
pH, Ven: 7.356 (ref 7.250–7.430)
pO2, Ven: 59 mmHg — ABNORMAL HIGH (ref 32.0–45.0)

## 2018-01-18 LAB — BASIC METABOLIC PANEL
ANION GAP: 13 (ref 5–15)
BUN: 31 mg/dL — AB (ref 6–20)
CO2: 27 mmol/L (ref 22–32)
Calcium: 6.5 mg/dL — ABNORMAL LOW (ref 8.9–10.3)
Chloride: 98 mmol/L — ABNORMAL LOW (ref 101–111)
Creatinine, Ser: 2.6 mg/dL — ABNORMAL HIGH (ref 0.61–1.24)
GFR calc Af Amer: 33 mL/min — ABNORMAL LOW (ref >60)
GFR calc non Af Amer: 29 mL/min — ABNORMAL LOW (ref >60)
GLUCOSE: 148 mg/dL — AB (ref 65–99)
POTASSIUM: 3.1 mmol/L — AB (ref 3.5–5.1)
Sodium: 138 mmol/L (ref 135–145)

## 2018-01-18 LAB — URINE CULTURE
CULTURE: NO GROWTH
Special Requests: NORMAL

## 2018-01-18 LAB — COMPREHENSIVE METABOLIC PANEL
ALBUMIN: 2.2 g/dL — AB (ref 3.5–5.0)
ALT: 359 U/L — ABNORMAL HIGH (ref 17–63)
ANION GAP: 13 (ref 5–15)
AST: 497 U/L — ABNORMAL HIGH (ref 15–41)
Alkaline Phosphatase: 57 U/L (ref 38–126)
BILIRUBIN TOTAL: 0.9 mg/dL (ref 0.3–1.2)
BUN: 34 mg/dL — ABNORMAL HIGH (ref 6–20)
CO2: 31 mmol/L (ref 22–32)
Calcium: 6 mg/dL — CL (ref 8.9–10.3)
Chloride: 94 mmol/L — ABNORMAL LOW (ref 101–111)
Creatinine, Ser: 2.71 mg/dL — ABNORMAL HIGH (ref 0.61–1.24)
GFR calc Af Amer: 31 mL/min — ABNORMAL LOW (ref >60)
GFR calc non Af Amer: 27 mL/min — ABNORMAL LOW (ref >60)
GLUCOSE: 107 mg/dL — AB (ref 65–99)
POTASSIUM: 3 mmol/L — AB (ref 3.5–5.1)
Sodium: 138 mmol/L (ref 135–145)
TOTAL PROTEIN: 4.3 g/dL — AB (ref 6.5–8.1)

## 2018-01-18 LAB — GLUCOSE, CAPILLARY
GLUCOSE-CAPILLARY: 112 mg/dL — AB (ref 65–99)
GLUCOSE-CAPILLARY: 134 mg/dL — AB (ref 65–99)
GLUCOSE-CAPILLARY: 175 mg/dL — AB (ref 65–99)
Glucose-Capillary: 113 mg/dL — ABNORMAL HIGH (ref 65–99)
Glucose-Capillary: 121 mg/dL — ABNORMAL HIGH (ref 65–99)
Glucose-Capillary: 133 mg/dL — ABNORMAL HIGH (ref 65–99)
Glucose-Capillary: 140 mg/dL — ABNORMAL HIGH (ref 65–99)
Glucose-Capillary: 214 mg/dL — ABNORMAL HIGH (ref 65–99)
Glucose-Capillary: 229 mg/dL — ABNORMAL HIGH (ref 65–99)
Glucose-Capillary: 23 mg/dL — CL (ref 65–99)

## 2018-01-18 LAB — CBC
HEMATOCRIT: 31.3 % — AB (ref 39.0–52.0)
Hemoglobin: 10.7 g/dL — ABNORMAL LOW (ref 13.0–17.0)
MCH: 30.1 pg (ref 26.0–34.0)
MCHC: 34.2 g/dL (ref 30.0–36.0)
MCV: 87.9 fL (ref 78.0–100.0)
PLATELETS: 98 10*3/uL — AB (ref 150–400)
RBC: 3.56 MIL/uL — ABNORMAL LOW (ref 4.22–5.81)
RDW: 14.5 % (ref 11.5–15.5)
WBC: 10.8 10*3/uL — AB (ref 4.0–10.5)

## 2018-01-18 LAB — URINALYSIS, ROUTINE W REFLEX MICROSCOPIC
Bilirubin Urine: NEGATIVE
GLUCOSE, UA: 100 mg/dL — AB
KETONES UR: NEGATIVE mg/dL
LEUKOCYTES UA: NEGATIVE
NITRITE: NEGATIVE
PH: 8 (ref 5.0–8.0)
Protein, ur: NEGATIVE mg/dL
SPECIFIC GRAVITY, URINE: 1.01 (ref 1.005–1.030)

## 2018-01-18 LAB — TYPE AND SCREEN
ABO/RH(D): O POS
Antibody Screen: NEGATIVE
UNIT DIVISION: 0
Unit division: 0

## 2018-01-18 LAB — HEPATIC FUNCTION PANEL
ALBUMIN: 2.5 g/dL — AB (ref 3.5–5.0)
ALT: 317 U/L — AB (ref 17–63)
AST: 404 U/L — AB (ref 15–41)
Alkaline Phosphatase: 49 U/L (ref 38–126)
BILIRUBIN DIRECT: 0.1 mg/dL (ref 0.1–0.5)
Indirect Bilirubin: 0.7 mg/dL (ref 0.3–0.9)
TOTAL PROTEIN: 4.4 g/dL — AB (ref 6.5–8.1)
Total Bilirubin: 0.8 mg/dL (ref 0.3–1.2)

## 2018-01-18 LAB — POCT I-STAT 3, ART BLOOD GAS (G3+)
Acid-Base Excess: 7 mmol/L — ABNORMAL HIGH (ref 0.0–2.0)
Acid-Base Excess: 2 mmol/L (ref 0.0–2.0)
Acid-Base Excess: 2 mmol/L (ref 0.0–2.0)
BICARBONATE: 29.3 mmol/L — AB (ref 20.0–28.0)
Bicarbonate: 33.4 mmol/L — ABNORMAL HIGH (ref 20.0–28.0)
Bicarbonate: 27.6 mmol/L (ref 20.0–28.0)
O2 SAT: 100 %
O2 Saturation: 98 %
O2 Saturation: 99 %
PH ART: 7.318 — AB (ref 7.350–7.450)
PO2 ART: 148 mmHg — AB (ref 83.0–108.0)
Patient temperature: 35.1
Patient temperature: 36.8
TCO2: 35 mmol/L — ABNORMAL HIGH (ref 22–32)
TCO2: 29 mmol/L (ref 22–32)
TCO2: 31 mmol/L (ref 22–32)
pCO2 arterial: 55.9 mmHg — ABNORMAL HIGH (ref 32.0–48.0)
pCO2 arterial: 42.3 mmHg (ref 32.0–48.0)
pCO2 arterial: 57 mmHg — ABNORMAL HIGH (ref 32.0–48.0)
pH, Arterial: 7.384 (ref 7.350–7.450)
pH, Arterial: 7.414 (ref 7.350–7.450)
pO2, Arterial: 393 mmHg — ABNORMAL HIGH (ref 83.0–108.0)
pO2, Arterial: 89 mmHg (ref 83.0–108.0)

## 2018-01-18 LAB — LACTIC ACID, PLASMA: LACTIC ACID, VENOUS: 1.3 mmol/L (ref 0.5–1.9)

## 2018-01-18 LAB — CREATININE CLEARANCE, URINE, 24 HOUR
COLLECTION INTERVAL-CRCL: 12 h
CREAT CLEAR: 28 mL/min — AB (ref 75–125)
Creatinine, 24H Ur: 1078 mg/d (ref 800–2000)
Creatinine, Urine: 31.7 mg/dL
Urine Total Volume-CRCL: 1700 mL

## 2018-01-18 LAB — APTT: aPTT: 37 seconds — ABNORMAL HIGH (ref 24–36)

## 2018-01-18 LAB — URINALYSIS, MICROSCOPIC (REFLEX): Squamous Epithelial / LPF: NONE SEEN

## 2018-01-18 LAB — PROTIME-INR
INR: 1.5
Prothrombin Time: 17.9 seconds — ABNORMAL HIGH (ref 11.4–15.2)

## 2018-01-18 LAB — PHOSPHORUS: PHOSPHORUS: 4.9 mg/dL — AB (ref 2.5–4.6)

## 2018-01-18 LAB — CG4 I-STAT (LACTIC ACID): LACTIC ACID, VENOUS: 2.53 mmol/L — AB (ref 0.5–1.9)

## 2018-01-18 SURGERY — RIGHT/LEFT HEART CATH AND CORONARY ANGIOGRAPHY
Anesthesia: LOCAL

## 2018-01-18 SURGERY — SURGICAL PROCUREMENT, ORGAN
Anesthesia: General

## 2018-01-18 MED ORDER — HEPARIN (PORCINE) IN NACL 2-0.9 UNIT/ML-% IJ SOLN
INTRAMUSCULAR | Status: AC | PRN
  Administered 2018-01-18 (×2): 500 mL

## 2018-01-18 MED ORDER — PHENYLEPHRINE HCL 10 MG/ML IJ SOLN
INTRAVENOUS | Status: DC | PRN
  Administered 2018-01-18: 100 ug/min via INTRAVENOUS

## 2018-01-18 MED ORDER — LIDOCAINE HCL (PF) 1 % IJ SOLN
INTRAMUSCULAR | Status: AC
  Filled 2018-01-18: qty 30

## 2018-01-18 MED ORDER — SODIUM CHLORIDE 0.9 % IV SOLN
2.0000 g | Freq: Once | INTRAVENOUS | Status: AC
  Administered 2018-01-18: 2 g via INTRAVENOUS
  Filled 2018-01-18: qty 20

## 2018-01-18 MED ORDER — VECURONIUM BROMIDE 10 MG IV SOLR
10.0000 mg | Freq: Once | INTRAVENOUS | Status: DC
  Filled 2018-01-18: qty 10

## 2018-01-18 MED ORDER — SODIUM CHLORIDE 0.9 % IV SOLN: INTRAVENOUS | Status: DC

## 2018-01-18 MED ORDER — ALBUMIN HUMAN 5 % IV SOLN
25.0000 g | Freq: Once | INTRAVENOUS | Status: AC
  Administered 2018-01-18: 25 g via INTRAVENOUS
  Filled 2018-01-18: qty 250

## 2018-01-18 MED ORDER — INSULIN ASPART 100 UNIT/ML ~~LOC~~ SOLN
10.0000 [IU] | Freq: Once | SUBCUTANEOUS | Status: AC
  Administered 2018-01-18: 10 [IU] via SUBCUTANEOUS

## 2018-01-18 MED ORDER — SODIUM CHLORIDE 0.9% FLUSH
3.0000 mL | Freq: Two times a day (BID) | INTRAVENOUS | Status: DC
  Administered 2018-01-18 (×2): 3 mL via INTRAVENOUS

## 2018-01-18 MED ORDER — IOPAMIDOL (ISOVUE-370) INJECTION 76%
INTRAVENOUS | Status: DC | PRN
  Administered 2018-01-18: 55 mL via INTRA_ARTERIAL

## 2018-01-18 MED ORDER — EPINEPHRINE PF 1 MG/ML IJ SOLN
INTRAMUSCULAR | Status: AC
  Filled 2018-01-18: qty 1

## 2018-01-18 MED ORDER — METRONIDAZOLE IVPB CUSTOM
1.0000 g | Freq: Once | INTRAVENOUS | Status: AC
  Administered 2018-01-18: 12:00:00 1 g via INTRAVENOUS
  Filled 2018-01-18 (×2): qty 200

## 2018-01-18 MED ORDER — LACTATED RINGERS IV SOLN: Freq: Once | INTRAVENOUS | Status: AC

## 2018-01-18 MED ORDER — SODIUM CHLORIDE 0.9 % IV SOLN
Freq: Once | INTRAVENOUS | Status: AC
  Administered 2018-01-18: 12:00:00 via INTRAVENOUS

## 2018-01-18 MED ORDER — IOPAMIDOL (ISOVUE-370) INJECTION 76%
INTRAVENOUS | Status: AC
  Filled 2018-01-18: qty 100

## 2018-01-18 MED ORDER — METOPROLOL TARTRATE 5 MG/5ML IV SOLN
5.0000 mg | Freq: Once | INTRAVENOUS | Status: AC
  Administered 2018-01-18: 5 mg via INTRAVENOUS
  Filled 2018-01-18: qty 5

## 2018-01-18 MED ORDER — HEPARIN SODIUM (PORCINE) 1000 UNIT/ML IJ SOLN
30.0000 mL | Freq: Once | INTRAMUSCULAR | Status: AC
  Administered 2018-01-18: 30000 [IU] via INTRAVENOUS
  Filled 2018-01-18: qty 30

## 2018-01-18 MED ORDER — CIPROFLOXACIN IN D5W 400 MG/200ML IV SOLN
400.0000 mg | Freq: Once | INTRAVENOUS | Status: AC
  Administered 2018-01-18: 400 mg via INTRAVENOUS
  Filled 2018-01-18 (×2): qty 200

## 2018-01-18 MED ORDER — NICARDIPINE HCL IN NACL 40-0.83 MG/200ML-% IV SOLN
3.0000 mg/h | INTRAVENOUS | Status: DC
  Administered 2018-01-18: 10 mg/h via INTRAVENOUS
  Filled 2018-01-18 (×2): qty 200

## 2018-01-18 MED ORDER — SODIUM CHLORIDE 0.9 % IV SOLN
250.0000 mL | INTRAVENOUS | Status: DC | PRN
Start: 2018-01-18 — End: 2018-01-18

## 2018-01-18 MED ORDER — HEPARIN (PORCINE) IN NACL 2-0.9 UNIT/ML-% IJ SOLN
INTRAMUSCULAR | Status: AC
  Filled 2018-01-18: qty 1500

## 2018-01-18 MED ORDER — ROCURONIUM BROMIDE 100 MG/10ML IV SOLN
INTRAVENOUS | Status: DC | PRN
  Administered 2018-01-18: 100 mg via INTRAVENOUS

## 2018-01-18 MED ORDER — ROCURONIUM BROMIDE 10 MG/ML (PF) SYRINGE
PREFILLED_SYRINGE | INTRAVENOUS | Status: AC
  Filled 2018-01-18: qty 5

## 2018-01-18 MED ORDER — LACTATED RINGERS IV SOLN
INTRAVENOUS | Status: DC | PRN
  Administered 2018-01-18: 14:00:00 via INTRAVENOUS

## 2018-01-18 MED ORDER — LIDOCAINE HCL (PF) 1 % IJ SOLN
INTRAMUSCULAR | Status: DC | PRN
  Administered 2018-01-18: 20 mL

## 2018-01-18 MED ORDER — SODIUM CHLORIDE 0.9% FLUSH: 3.0000 mL | INTRAVENOUS | Status: DC | PRN

## 2018-01-18 MED ORDER — PHENYLEPHRINE 40 MCG/ML (10ML) SYRINGE FOR IV PUSH (FOR BLOOD PRESSURE SUPPORT)
PREFILLED_SYRINGE | INTRAVENOUS | Status: DC | PRN
  Administered 2018-01-18: 80 ug via INTRAVENOUS

## 2018-01-18 MED ORDER — LACTATED RINGERS IV SOLN
INTRAVENOUS | Status: DC
  Administered 2018-01-18: 05:00:00 via INTRAVENOUS

## 2018-01-18 MED ORDER — NICARDIPINE HCL IN NACL 20-0.86 MG/200ML-% IV SOLN
3.0000 mg/h | INTRAVENOUS | Status: DC
  Administered 2018-01-18: 13.5 mg/h via INTRAVENOUS
  Administered 2018-01-18: 5 mg/h via INTRAVENOUS
  Filled 2018-01-18: qty 200

## 2018-01-18 MED ORDER — FUROSEMIDE 10 MG/ML IJ SOLN
INTRAMUSCULAR | Status: DC | PRN
  Administered 2018-01-18: 80 mg via INTRAMUSCULAR

## 2018-01-18 MED FILL — Medication: Qty: 1 | Status: AC

## 2018-01-18 SURGICAL SUPPLY — 11 items
CATH INFINITI 5FR MULTPACK ANG (CATHETERS) ×3 IMPLANT
CATH SWAN GANZ 7F STRAIGHT (CATHETERS) ×3 IMPLANT
COVER PRB 48X5XTLSCP FOLD TPE (BAG) ×2 IMPLANT
COVER PROBE 5X48 (BAG) ×1
KIT HEART LEFT (KITS) ×3 IMPLANT
PACK CARDIAC CATHETERIZATION (CUSTOM PROCEDURE TRAY) ×3 IMPLANT
SHEATH PINNACLE 5F 10CM (SHEATH) ×3 IMPLANT
SHEATH PINNACLE 7F 10CM (SHEATH) ×3 IMPLANT
TRANSDUCER W/STOPCOCK (MISCELLANEOUS) ×3 IMPLANT
TUBING CIL FLEX 10 FLL-RA (TUBING) ×3 IMPLANT
WIRE EMERALD 3MM-J .035X150CM (WIRE) ×3 IMPLANT

## 2018-01-18 SURGICAL SUPPLY — 99 items
BAG DECANTER FOR FLEXI CONT (MISCELLANEOUS) ×12 IMPLANT
BLADE 10 SAFETY STRL DISP (BLADE) ×3 IMPLANT
BLADE CLIPPER SURG (BLADE) IMPLANT
BLADE STERNUM SYSTEM 6 (BLADE) ×3 IMPLANT
BLADE SURG 10 STRL SS (BLADE) ×6 IMPLANT
BRUSH CYTOL CELLEBRITY 1.5X140 (MISCELLANEOUS) IMPLANT
CANISTER SUCT 3000ML PPV (MISCELLANEOUS) ×3 IMPLANT
CANNULA VESSEL W/WING WO/VALVE (CANNULA) ×3 IMPLANT
CLIP TI LARGE 6 (CLIP) ×6 IMPLANT
CLIP VESOCCLUDE MED 24/CT (CLIP) ×6 IMPLANT
CLIP VESOCCLUDE SM WIDE 24/CT (CLIP) ×3 IMPLANT
CONT SPEC 4OZ CLIKSEAL STRL BL (MISCELLANEOUS) ×9 IMPLANT
COVER BACK TABLE 60X90IN (DRAPES) ×6 IMPLANT
COVER MAYO STAND STRL (DRAPES) ×3 IMPLANT
COVER SURGICAL LIGHT HANDLE (MISCELLANEOUS) ×3 IMPLANT
DRAPE HALF SHEET 40X57 (DRAPES) IMPLANT
DRAPE SLUSH MACHINE 52X66 (DRAPES) ×3 IMPLANT
DRSG COVADERM 4X10 (GAUZE/BANDAGES/DRESSINGS) ×6 IMPLANT
DRSG TELFA 3X8 NADH (GAUZE/BANDAGES/DRESSINGS) ×3 IMPLANT
DURAPREP 26ML APPLICATOR (WOUND CARE) ×6 IMPLANT
ELECT BLADE 6.5 EXT (BLADE) ×3 IMPLANT
ELECT REM PT RETURN 9FT ADLT (ELECTROSURGICAL) ×6
ELECTRODE REM PT RTRN 9FT ADLT (ELECTROSURGICAL) ×2 IMPLANT
FILTER STRAW FLUID ASPIR (MISCELLANEOUS) IMPLANT
FORCEPS BIOP RJ4 1.8 (CUTTING FORCEPS) IMPLANT
FORCEPS RADIAL JAW LRG 4 PULM (INSTRUMENTS) IMPLANT
GAUZE SPONGE 4X4 12PLY STRL (GAUZE/BANDAGES/DRESSINGS) ×3 IMPLANT
GAUZE SPONGE 4X4 16PLY XRAY LF (GAUZE/BANDAGES/DRESSINGS) IMPLANT
GLOVE BIO SURGEON STRL SZ 6 (GLOVE) ×3 IMPLANT
GLOVE BIO SURGEON STRL SZ7 (GLOVE) ×3 IMPLANT
GLOVE BIO SURGEON STRL SZ7.5 (GLOVE) ×3 IMPLANT
GLOVE BIOGEL PI IND STRL 6.5 (GLOVE) ×2 IMPLANT
GLOVE BIOGEL PI IND STRL 7.0 (GLOVE) ×2 IMPLANT
GLOVE BIOGEL PI IND STRL 8 (GLOVE) ×1 IMPLANT
GLOVE BIOGEL PI INDICATOR 6.5 (GLOVE) ×4
GLOVE BIOGEL PI INDICATOR 7.0 (GLOVE) ×4
GLOVE BIOGEL PI INDICATOR 8 (GLOVE) ×2
GLOVE ECLIPSE 6.5 STRL STRAW (GLOVE) ×3 IMPLANT
GLOVE ECLIPSE 7.0 STRL STRAW (GLOVE) ×3 IMPLANT
GLOVE SURG SIGNA 7.5 PF LTX (GLOVE) ×3 IMPLANT
GOWN STRL REUS W/ TWL LRG LVL3 (GOWN DISPOSABLE) ×7 IMPLANT
GOWN STRL REUS W/ TWL XL LVL3 (GOWN DISPOSABLE) ×2 IMPLANT
GOWN STRL REUS W/TWL LRG LVL3 (GOWN DISPOSABLE) ×14
GOWN STRL REUS W/TWL XL LVL3 (GOWN DISPOSABLE) ×4
HANDLE SUCTION POOLE (INSTRUMENTS) ×1 IMPLANT
KIT CLEAN ENDO COMPLIANCE (KITS) ×3 IMPLANT
KIT POST MORTEM ADULT 36X90 (BAG) ×3 IMPLANT
KIT ROOM TURNOVER OR (KITS) ×3 IMPLANT
LOOP VESSEL MAXI BLUE (MISCELLANEOUS) ×3 IMPLANT
LOOP VESSEL MINI RED (MISCELLANEOUS) ×6 IMPLANT
MANIFOLD NEPTUNE II (INSTRUMENTS) IMPLANT
MARKER SKIN DUAL TIP RULER LAB (MISCELLANEOUS) ×3 IMPLANT
NEEDLE BIOPSY 14X6 SOFT TISS (NEEDLE) ×6 IMPLANT
NS IRRIG 1000ML POUR BTL (IV SOLUTION) ×3 IMPLANT
OIL SILICONE PENTAX (PARTS (SERVICE/REPAIRS)) ×3 IMPLANT
PACK AORTA (CUSTOM PROCEDURE TRAY) ×3 IMPLANT
PAD ARMBOARD 7.5X6 YLW CONV (MISCELLANEOUS) ×6 IMPLANT
PENCIL BUTTON HOLSTER BLD 10FT (ELECTRODE) ×6 IMPLANT
RADIAL JAW LRG 4 PULMONARY (INSTRUMENTS)
SOL PREP POV-IOD 4OZ 10% (MISCELLANEOUS) ×3 IMPLANT
SPONGE INTESTINAL PEANUT (DISPOSABLE) ×6 IMPLANT
SPONGE LAP 18X18 X RAY DECT (DISPOSABLE) IMPLANT
STAPLER VISISTAT 35W (STAPLE) ×6 IMPLANT
SUCTION POOLE HANDLE (INSTRUMENTS) ×3
SUCTION POOLE TIP (SUCTIONS) ×6 IMPLANT
SUT BONE WAX W31G (SUTURE) ×6 IMPLANT
SUT ETHIBOND 5 LR DA (SUTURE) IMPLANT
SUT ETHILON 1 LR 30 (SUTURE) ×12 IMPLANT
SUT ETHILON 2 LR (SUTURE) ×6 IMPLANT
SUT PROLENE 4 0 RB 1 (SUTURE) ×4
SUT PROLENE 4-0 RB1 .5 CRCL 36 (SUTURE) ×2 IMPLANT
SUT PROLENE 6 0 BV (SUTURE) ×3 IMPLANT
SUT SILK 0 TIES 10X30 (SUTURE) ×3 IMPLANT
SUT SILK 1 SH (SUTURE) ×18 IMPLANT
SUT SILK 1 TIES 10X30 (SUTURE) IMPLANT
SUT SILK 2 0 (SUTURE)
SUT SILK 2 0 SH (SUTURE) IMPLANT
SUT SILK 2 0 SH CR/8 (SUTURE) ×6 IMPLANT
SUT SILK 2 0 TIES 10X30 (SUTURE) ×3 IMPLANT
SUT SILK 2-0 18XBRD TIE 12 (SUTURE) IMPLANT
SUT SILK 3 0 TIES 10X30 (SUTURE) ×3 IMPLANT
SWAB COLLECTION DEVICE MRSA (MISCELLANEOUS) IMPLANT
SWAB CULTURE ESWAB REG 1ML (MISCELLANEOUS) IMPLANT
SYR 20ML ECCENTRIC (SYRINGE) ×6 IMPLANT
SYR 5ML LL (SYRINGE) ×3 IMPLANT
SYR 5ML LUER SLIP (SYRINGE) ×3 IMPLANT
SYRINGE TOOMEY DISP (SYRINGE) ×3 IMPLANT
TAPE UMBILICAL 1/8 X36 TWILL (MISCELLANEOUS) ×6 IMPLANT
TOWEL GREEN STERILE (TOWEL DISPOSABLE) ×3 IMPLANT
TOWEL GREEN STERILE FF (TOWEL DISPOSABLE) ×3 IMPLANT
TOWEL OR 17X26 10 PK STRL BLUE (TOWEL DISPOSABLE) ×3 IMPLANT
TRAP SPECIMEN MUCOUS 40CC (MISCELLANEOUS) ×3 IMPLANT
TUBE CONNECTING 12'X1/4 (SUCTIONS) ×2
TUBE CONNECTING 12X1/4 (SUCTIONS) ×4 IMPLANT
TUBE CONNECTING 20'X1/4 (TUBING) ×3
TUBE CONNECTING 20X1/4 (TUBING) ×6 IMPLANT
VALVE DISPOSABLE (MISCELLANEOUS) ×3 IMPLANT
WATER STERILE IRR 1000ML POUR (IV SOLUTION) IMPLANT
YANKAUER SUCT BULB TIP NO VENT (SUCTIONS) ×12 IMPLANT

## 2018-01-19 ENCOUNTER — Encounter (HOSPITAL_COMMUNITY): Payer: Self-pay | Admitting: Anesthesiology

## 2018-01-19 LAB — CULTURE, RESPIRATORY W GRAM STAIN
Culture: NORMAL
Special Requests: NORMAL

## 2018-01-20 LAB — DRUG PROFILE, UR, 9 DRUGS (LABCORP)
AMPHETAMINES, URINE: POSITIVE — AB
BENZODIAZEPINE QUANT UR: NEGATIVE ng/mL
Barbiturate, Ur: NEGATIVE ng/mL
Cannabinoid Quant, Ur: NEGATIVE ng/mL
Cocaine (Metab.): NEGATIVE ng/mL
Methadone Screen, Urine: NEGATIVE ng/mL
OPIATE QUANT UR: NEGATIVE ng/mL
PROPOXYPHENE, URINE: NEGATIVE ng/mL
Phencyclidine, Ur: NEGATIVE ng/mL

## 2018-01-22 LAB — CULTURE, BLOOD (ROUTINE X 2)
CULTURE: NO GROWTH
CULTURE: NO GROWTH
SPECIAL REQUESTS: ADEQUATE
Special Requests: ADEQUATE

## 2018-01-22 LAB — CARBOXYHEMOGLOBIN - COOX

## 2018-02-15 NOTE — Anesthesia Postprocedure Evaluation (Signed)
Anesthesia Post Note  Patient: Joshua GasmanCarl Linwood Kerper  Procedure(s) Performed: ORGAN PROCUREMENT, LIVER, KIDNEY, HEART (N/A ) VIDEO BRONCHOSCOPY WITHOUT FLUORO (N/A )     Anesthesia Type: General Anesthetic complications: no Comments: Patient was organ donor and expired in the OR    Last Vitals:  Vitals:   01/16/2018 1215 02/12/2018 1225  BP:    Pulse: (!) 117 (!) 116  Resp: (!) 24 (!) 24  Temp: (!) 36.2 C (!) 36.2 C  SpO2: 97% 97%    Last Pain:  Vitals:   02/09/2018 0800  TempSrc: Core (Comment)  PainSc:                  Yareni Creps COKER

## 2018-02-15 NOTE — Progress Notes (Signed)
CRITICAL CARE PROGRESS NOTE   Patient Summary:     Name: Joshua Colon MRN:   [id number] DOB:   10-Jul-1976             ADMISSION DATE:  01/21/2018 CHIEF COMPLAINT: Out of hospital cardiac arrest.  HISTORY OF PRESENT ILLNESS:       The patient was found unresponsive in the field with an unknown downtime.  He was apneic and pulseless when EMS arrived.  CPR was initiated he was given 1 mg of epinephrine followed by several small boluses and return of circulation was established.  He was reintubated in the department of emergency medicine and did not require any drugs for intubation.  Family apparently reported a history of drug abuse.  Subjective: Has been declared brain dead. Awaiting organ harvesting in OR today. Comatose; on vent; stable w/out pressor.   PHYSICAL EXAM:    Eyes: Pupils fixed  Neck: No neck swelling  CVS:  -s1 s2 regular  Resp:Breath sounds equal; no rales  Abdomen:abd-soft, non distended  Extremities:No edema  Neuro: tone reduced  Skin: no rash     Ventilator  Fi02- 60%, Peep- 8, Rate- 24, Vt- 524m  Pip- 20s   No AutoPEEP  Current medication list reviewed.   Current Facility-Administered Medications:  .  0.9 %  sodium chloride infusion, 250 mL, Intravenous, PRN, GSampson Goon MD, Last Rate: 10 mL/hr at 0March 12, [id number], 250 mL at [id number] .  Place/Maintain arterial line, , , Until Discontinued **AND** 0.9 %  sodium chloride infusion, , Intra-arterial, PRN, YRush Farmer MD, Last Rate: 20 mL/hr at 012-Mar-20190800 .  albuterol (PROVENTIL) (2.5 MG/3ML) 0.083% nebulizer solution 2.5 mg, 2.5 mg, Nebulization, Q4H PRN, YRush Farmer MD, 2.5 mg at 02/06/2018 2111 .  chlorhexidine gluconate (MEDLINE KIT) (PERIDEX) 0.12 % solution 15 mL, 15 mL, Mouth Rinse, BID, GSampson Goon MD, 15 mL at 0March 12, 20190800 .  Chlorhexidine Gluconate Cloth 2 % PADS 6 each, 6 each, Topical, Daily, YRush Farmer MD, 6 each at 02/14/2018 2218 .  ciprofloxacin (CIPRO) IVPB  400 mg, 400 mg, Intravenous, Once, Yacoub, Wesam G, MD .  dextrose 5 % and 0.9 % NaCl with KCl 20 mEq/L infusion, , Intravenous, Continuous, YRush Farmer MD, Last Rate: 125 mL/hr at 012-Mar-20190845 .  famotidine (PEPCID) IVPB 20 mg premix, 20 mg, Intravenous, Q12H, GSampson Goon MD, Last Rate: 100 mL/hr at 003-12-191007, 20 mg at 012-Mar-20191007 .  heparin injection 30,000 Units, 30 mL, Intravenous, Once, YRush Farmer MD .  insulin regular (NOVOLIN R,HUMULIN R) 100 Units in sodium chloride 0.9 % 100 mL (1 Units/mL) infusion, , Intravenous, Continuous, YRush Farmer MD, Stopped at 02/13/2018 1724 .  MEDLINE mouth rinse, 15 mL, Mouth Rinse, 10 times per day, GSampson Goon MD, 15 mL at 012-Mar-20191000 .  metroNIDAZOLE (FLAGYL) IVPB 1 g, 1 g, Intravenous, Once, YNelda Marseille WLloyd Huger MD .  naloxone ([employer] injection 1 mg, 1 mg, Intravenous, Once, SLajean Saver MD .  nicardipine (CARDENE) 49min 0.83% saline 20044mV DOUBLE STRENGTH infusion (0.2 mg/ml), 3-15 mg/hr, Intravenous, Continuous, GraSampson GoonD, Last Rate: 50 mL/hr at 01/18/11/1951, 10 mg/hr at 01/18/11/201951 .  norepinephrine (LEVOPHED) 16 mg in dextrose 5 % 250 mL (0.064 mg/mL) infusion, 0-40 mcg/min, Intravenous, Titrated, Yacoub, Wesam G, MD .  phenylephrine (NEO-SYNEPHRINE) 10 mg in sodium chloride 0.9 % 250 mL (0.04 mg/mL) infusion, 0-400 mcg/min, Intravenous, Titrated, YacNelda MarseilleesGood[employer] Corporation  G, MD, Stopped at 02/12/18 0203 .  piperacillin-tazobactam (ZOSYN) IVPB 3.375 g, 3.375 g, Intravenous, Q8H, Rolla Flatten, [facility], Stopped at 02-12-18 2952 .  sodium chloride flush (NS) 0.9 % injection 10-40 mL, 10-40 mL, Intracatheter, Q12H, Rush Farmer, MD, 10 mL at 02/06/2018 1512 .  sodium chloride flush (NS) 0.9 % injection 10-40 mL, 10-40 mL, Intracatheter, PRN, Rush Farmer, MD .  sodium chloride flush (NS) 0.9 % injection 3 mL, 3 mL, Intravenous, Q12H, Leonie Man, MD, 3 mL at Feb 12, 2018 0835 .  sodium chloride flush (NS) 0.9 %  injection 3 mL, 3 mL, Intravenous, PRN, Leonie Man, MD .  vasopressin (PITRESSIN) 40 Units in sodium chloride 0.9 % 250 mL (0.16 Units/mL) infusion, 0.04 Units/min, Intravenous, Continuous, Rush Farmer, MD, Stopped at 01/21/2018 1551 .  vecuronium (NORCURON) injection 10 mg, 10 mg, Intravenous, Once, Rush Farmer, MD    LABS:   Results for Joshua Colon (MRN [id number]) as of 12-Feb-2018 10:24  Ref. Range 2018/02/12 08:08  pH, Arterial Latest Ref Range: 7.350 - 7.450  7.414  pCO2 arterial Latest Ref Range: 32.0 - 48.0 mmHg 42.3  pO2, Arterial Latest Ref Range: 83.0 - 108.0 mmHg 89.0  TCO2 Latest Ref Range: 22 - 32 mmol/L 29    Results for Joshua Colon (MRN [id number]) as of February 12, 2018 10:24  Ref. Range 02/06/2018 15:44  Amphetamines Latest Ref Range: NONE DETECTED  POSITIVE (A)  Barbiturates Latest Ref Range: NONE DETECTED  NONE DETECTED  Benzodiazepines Latest Ref Range: NONE DETECTED  NONE DETECTED  Opiates Latest Ref Range: NONE DETECTED  NONE DETECTED  COCAINE Latest Ref Range: NONE DETECTED  NONE DETECTED  Tetrahydrocannabinol Latest Ref Range: NONE DETECTED  NONE DETECTED     CBC Latest Ref Rng & Units Feb 12, 2018 02/03/2018 02/04/2018  WBC 4.0 - 10.5 K/uL 10.8(H) 6.4 10.4  Hemoglobin 13.0 - 17.0 g/dL 10.7(L) 11.2(L) 8.7(L)  Hematocrit 39.0 - 52.0 % 31.3(L) 32.5(L) 28.3(L)  Platelets 150 - 400 K/uL 98(L) 98(L) 138(L)    BMP Latest Ref Rng & Units Feb 12, 2018 01/26/2018 01/28/2018  Glucose 65 - 99 mg/dL 148(H) 107(H) 202(H)  BUN 6 - 20 mg/dL 31(H) 34(H) 38(H)  Creatinine 0.61 - 1.24 mg/dL 2.60(H) 2.71(H) 3.25(H)  Sodium 135 - 145 mmol/L 138 138 133(L)  Potassium 3.5 - 5.1 mmol/L 3.1(L) 3.0(L) 2.6(LL)  Chloride 101 - 111 mmol/L 98(L) 94(L) 86(L)  CO2 22 - 32 mmol/L 27 31 33(H)  Calcium 8.9 - 10.3 mg/dL 6.5(L) 6.0(LL) 5.3(LL)   CT A/P 01/26/2018  1. No acute findings are noted in the abdomen or pelvis. 2. Bibasilar subsegmental atelectasis in the lower lobes of  the lungs bilaterally. 3. Aortic atherosclerosis.    CT head and C spine 01/31/2018 1. Appearance of the brain suggests diffuse cerebral and cerebellar edema related to recent anoxic brain injury, as above. 2. No acute abnormality of the cervical spine.  CT chest 12-Feb-2018 1. Dependent lower lobe consolidations, significantly progressed from CT 2 days prior with lower lobe bronchial filling, suggesting aspiration. Small pleural effusions. 2. Multifocal small nodular and ground-glass opacities throughout both lungs, many of which are cavitary. Findings are nonspecific, favor infectious or inflammatory in etiology. While ground-glass nodules can be seen in the setting aspiration, the presence of central necrosis favors infection, including possible septic emboli. 3. Mediastinal adenopathy is likely reactive. 4. Small pericardial effusion. 5. Mild coronary artery calcifications. 6. Minimal septal thickening can be seen with pulmonary edema.  CHEST X-RAY:   01/27/2018 Endotracheal tube 3.8 cm from the carina. Enteric tube in place, tip below the diaphragm. Left subclavian central venous catheter tip in the mid SVC. Minimal worsening left lung base opacity likely combination of atelectasis and pleural fluid. Mild vascular congestion appears similar. Perihilar peribronchial thickening suspicious for mild pulmonary edema, unchanged. No pneumothorax. Unchanged heart size and mediastinal contours. IMPRESSION: 1. Slight worsening left lung base opacity likely combination of atelectasis and pleural effusion. 2. Exam is otherwise unchanged. Mild central pulmonary edema appears similar.     TTE 01/24/2018 - Left ventricle: The cavity size was normal. There was mild   concentric hypertrophy. Systolic function was normal. The   estimated ejection fraction was in the range of 55% to 60%. Wall   motion was normal; there were no regional wall motion   abnormalities. Left ventricular diastolic  function parameters   were normal. - Aortic valve: There was no regurgitation. - Aortic root: The aortic root was normal in size. - Mitral valve: There was trivial regurgitation. - Right ventricle: The cavity size was normal. Wall thickness was   normal. Systolic function was normal. - Tricuspid valve: There was mild regurgitation. - Pulmonic valve: There was trivial regurgitation. - Pulmonary arteries: Systolic pressure was mildly increased. PA   peak pressure: 35 mm Hg (S). - Inferior vena cava: The vessel was normal in size. - Pericardium, extracardiac: There was no pericardial effusion.   There was a left pleural effusion.  Ekg- sinus- no acute changes  Cardiac cath 01/19/18   Angiographically normal coronary arteries with a large right dominant system  LV end diastolic pressure is low normal  Hyperdynamic Left Ventricular Cardiac Output -likely related to tachycardia  LV end diastolic pressure is normal.  There is no aortic valve stenosis.  There is hyperdynamic left ventricular systolic function.  With Echocardiographic evidence of Normal LV Function, this completes cardiac donor evaluation. Angiographically relatively normal coronary arteries with some tortuosity suggestive of hypertension.  No notable disease noted.   Assessment- Plan: Out of hospital cardiac arrest in a known drug abuser that is positive for methamphetamine, unknown downtime, arrives with diffuse cerebral edema already.  Exam this AM consistent with brain death.  Will place a-line for ABG, perform apnea test.  Spoke with CDS, they will wait for patient to be declared first prior to approaching family then will need to decide where to go from there regarding further procedures.     continue present care;   plan to OR today for organ harvesting.     ^^^^^^^^^^ I  Have personally spent 20  Minutes  In the care of this Patient providing Critical care Services; Time includes review of chart, labs,  imaging, coordinating care with other physicians and healthcare team members. Also includes time for frequent reevaluation and additional treatment implementation due to change in clinical condiiton of patient. Excludes time spent for Procedure and Teaching.   ^^^^^^^^^^  Note subject to typographical and grammatical errors;   Any formal questions or concerns about the content, text, or information contained within the body of this dictation should be directly addressed to the physician  for  clarification.   Evans Lance, MD Pulmonary and [place] Pager: [phone]

## 2018-02-15 NOTE — Transfer of Care (Signed)
Immediate Anesthesia Transfer of Care Note  Patient: Joshua Colon  Procedure(s) Performed: ORGAN PROCUREMENT, LIVER, KIDNEY, HEART (N/A ) VIDEO BRONCHOSCOPY WITHOUT FLUORO (N/A )  Patient Location: PACU and In OR for organs only  Anesthesia Type:General  Level of Consciousness: Patient remains intubated per anesthesia plan  Airway & Oxygen Therapy: organ procurment  Post-op Assessment: Report given to RN  Post vital signs: Reviewed  Last Vitals:  Vitals:   02-18-2018 1215 02-18-2018 1225  BP:    Pulse: (!) 117 (!) 116  Resp: (!) 24 (!) 24  Temp: (!) 36.2 C (!) 36.2 C  SpO2: 97% 97%    Last Pain:  Vitals:   02-18-2018 0800  TempSrc: Core (Comment)  PainSc:          Complications: No apparent anesthesia complications

## 2018-02-15 NOTE — Discharge Summary (Signed)
Physician Discharge Summary  Patient ID: Joshua Colon MRN: [id number] DOB/AGE: Nov 18, 1975 42 y.o.  Admit date: 02/14/2018 Discharge date: 01/20/2018  Admission Diagnoses: Out of hospital cardiac arrest in a known drug abuser that is positive for methamphetamine, unknown downtime, arrives with diffuse cerebral edema already.   Discharge Diagnoses:  Active Problems:   Anoxic brain damage [facility])   Discharged Condition: expired   Hospital Course: see below  Consults: cardiology  Significant Diagnostic Studies: angiography: -cardiac cath   Treatments: procedures:- ventilator, cardiac cath and surgery: to procure organs    HISTORY OF PRESENT ILLNESS: The patient was found unresponsive in the field with an unknown downtime. He was apneic and pulseless when EMS arrived. CPR was initiated he was given 1 mg of epinephrine followed by several small boluses and return of circulation was established. He was reintubated in the department of emergency medicine and did not require any drugs for intubation. Family apparently reported a history of drug abuse.  Subjective: Has been declared brain dead. Awaiting organ harvesting in OR today. Comatose; on vent; stable w/out pressor.   PHYSICAL EXAM:    Eyes: Pupils fixed  Neck: No neck swelling  CVS:  -s1 s2 regular  Resp:Breath sounds equal; no rales  Abdomen:abd-soft, non distended  Extremities:No edema  Neuro: tone reduced  Skin: no rash     Ventilator  Fi02- 60%, Peep- 8, Rate- 24, Vt- 597m  Pip- 20s   No AutoPEEP   Current Facility-Administered Medications:  .  0.9 %  sodium chloride infusion, 250 mL, Intravenous, PRN, GSampson Goon MD, Last Rate: 10 mL/hr at [id number], 250 mL at 0March 06, [id number] .  Place/Maintain arterial line, , , Until Discontinued **AND** 0.9 %  sodium chloride infusion, , Intra-arterial, PRN, YRush Farmer MD, Last Rate: 20 mL/hr at [id number] .  albuterol  (PROVENTIL) (2.5 MG/3ML) 0.083% nebulizer solution 2.5 mg, 2.5 mg, Nebulization, Q4H PRN, YRush Farmer MD, 2.5 mg at 01/20/2018 2111 .  chlorhexidine gluconate (MEDLINE KIT) (PERIDEX) 0.12 % solution 15 mL, 15 mL, Mouth Rinse, BID, GSampson Goon MD, 15 mL at [id number] .  Chlorhexidine Gluconate Cloth 2 % PADS 6 each, 6 each, Topical, Daily, YRush Farmer MD, 6 each at 02/03/2018 2218 .  ciprofloxacin (CIPRO) IVPB 400 mg, 400 mg, Intravenous, Once, Yacoub, Wesam G, MD .  dextrose 5 % and 0.9 % NaCl with KCl 20 mEq/L infusion, , Intravenous, Continuous, YRush Farmer MD, Last Rate: 125 mL/hr at [id number] .  famotidine (PEPCID) IVPB 20 mg premix, 20 mg, Intravenous, Q12H, GSampson Goon MD, Last Rate: 100 mL/hr at 003/06/20191007, 20 mg at [id number] .  heparin injection 30,000 Units, 30 mL, Intravenous, Once, YRush Farmer MD .  insulin regular (NOVOLIN R,HUMULIN R) 100 Units in sodium chloride 0.9 % 100 mL (1 Units/mL) infusion, , Intravenous, Continuous, YRush Farmer MD, Stopped at 01/15/2018 1724 .  MEDLINE mouth rinse, 15 mL, Mouth Rinse, 10 times per day, GSampson Goon MD, 15 mL at 0March 06, [id number] .  metroNIDAZOLE (FLAGYL) IVPB 1 g, 1 g, Intravenous, Once, YNelda Marseille WLloyd Huger MD .  naloxone ([facility] injection 1 mg, 1 mg, Intravenous, Once, SLajean Saver MD .  nicardipine (CARDENE) 411min 0.83% saline 20072mV DOUBLE STRENGTH infusion (0.2 mg/ml), 3-15 mg/hr, Intravenous, Continuous, GraSampson GoonD, Last Rate: 50 mL/hr at 03/06-Mar-201951, 10 mg/hr at 01/2018-01-650 .  norepinephrine (LEVOPHED) 16 mg in dextrose 5 % 250  mL (0.064 mg/mL) infusion, 0-40 mcg/min, Intravenous, Titrated, Yacoub, Wesam G, MD .  phenylephrine (NEO-SYNEPHRINE) 10 mg in sodium chloride 0.9 % 250 mL (0.04 mg/mL) infusion, 0-400 mcg/min, Intravenous, Titrated, Rush Farmer, MD, Stopped at 02-11-2018 0203 .  piperacillin-tazobactam (ZOSYN) IVPB 3.375 g, 3.375 g, Intravenous, Q8H, Rolla Flatten, [facility],  Stopped at 2018-02-11 7654 .  sodium chloride flush (NS) 0.9 % injection 10-40 mL, 10-40 mL, Intracatheter, Q12H, Rush Farmer, MD, 10 mL at 02/12/2018 1512 .  sodium chloride flush (NS) 0.9 % injection 10-40 mL, 10-40 mL, Intracatheter, PRN, Rush Farmer, MD .  sodium chloride flush (NS) 0.9 % injection 3 mL, 3 mL, Intravenous, Q12H, Leonie Man, MD, 3 mL at 11-Feb-2018 0835 .  sodium chloride flush (NS) 0.9 % injection 3 mL, 3 mL, Intravenous, PRN, Leonie Man, MD .  vasopressin (PITRESSIN) 40 Units in sodium chloride 0.9 % 250 mL (0.16 Units/mL) infusion, 0.04 Units/min, Intravenous, Continuous, Rush Farmer, MD, Stopped at 01/26/2018 1551 .  vecuronium (NORCURON) injection 10 mg, 10 mg, Intravenous, Once, Rush Farmer, MD    LABS:   Results for GLEEN, RIPBERGER (MRN [id number]) as of 02/11/18 10:24  Ref. Range 02/11/2018 08:08  pH, Arterial Latest Ref Range: 7.350 - 7.450  7.414  pCO2 arterial Latest Ref Range: 32.0 - 48.0 mmHg 42.3  pO2, Arterial Latest Ref Range: 83.0 - 108.0 mmHg 89.0  TCO2 Latest Ref Range: 22 - 32 mmol/L 29    Results for EUDELL, MCPHEE (MRN [id number]) as of February 11, 2018 10:24  Ref. Range 01/29/2018 15:44  Amphetamines Latest Ref Range: NONE DETECTED  POSITIVE (A)  Barbiturates Latest Ref Range: NONE DETECTED  NONE DETECTED  Benzodiazepines Latest Ref Range: NONE DETECTED  NONE DETECTED  Opiates Latest Ref Range: NONE DETECTED  NONE DETECTED  COCAINE Latest Ref Range: NONE DETECTED  NONE DETECTED  Tetrahydrocannabinol Latest Ref Range: NONE DETECTED  NONE DETECTED     CBC Latest Ref Rng & Units February 11, 2018 02/12/2018 01/25/2018  WBC 4.0 - 10.5 K/uL 10.8(H) 6.4 10.4  Hemoglobin 13.0 - 17.0 g/dL 10.7(L) 11.2(L) 8.7(L)  Hematocrit 39.0 - 52.0 % 31.3(L) 32.5(L) 28.3(L)  Platelets 150 - 400 K/uL 98(L) 98(L) 138(L)    BMP Latest Ref Rng & Units 02-11-18 02/01/2018 01/20/2018  Glucose 65 - 99 mg/dL 148(H) 107(H) 202(H)  BUN 6 - 20 mg/dL 31(H)  34(H) 38(H)  Creatinine 0.61 - 1.24 mg/dL 2.60(H) 2.71(H) 3.25(H)  Sodium 135 - 145 mmol/L 138 138 133(L)  Potassium 3.5 - 5.1 mmol/L 3.1(L) 3.0(L) 2.6(LL)  Chloride 101 - 111 mmol/L 98(L) 94(L) 86(L)  CO2 22 - 32 mmol/L 27 31 33(H)  Calcium 8.9 - 10.3 mg/dL 6.5(L) 6.0(LL) 5.3(LL)   CT A/P 02/08/2018  1. No acute findings are noted in the abdomen or pelvis. 2. Bibasilar subsegmental atelectasis in the lower lobes of the lungs bilaterally. 3. Aortic atherosclerosis.    CT head and C spine 02/11/2018 1. Appearance of the brain suggests diffuse cerebral and cerebellar edema related to recent anoxic brain injury, as above. 2. No acute abnormality of the cervical spine.  CT chest 02-11-2018 1. Dependent lower lobe consolidations, significantly progressed from CT 2 days prior with lower lobe bronchial filling, suggesting aspiration. Small pleural effusions. 2. Multifocal small nodular and ground-glass opacities throughout both lungs, many of which are cavitary. Findings are nonspecific, favor infectious or inflammatory in etiology. While ground-glass nodules can be seen in the setting aspiration, the presence of central necrosis  favors infection, including possible septic emboli. 3. Mediastinal adenopathy is likely reactive. 4. Small pericardial effusion. 5. Mild coronary artery calcifications. 6. Minimal septal thickening can be seen with pulmonary edema.    CHEST X-RAY:   02/06/2018 Endotracheal tube 3.8 cm from the carina. Enteric tube in place, tip below the diaphragm. Left subclavian central venous catheter tip in the mid SVC. Minimal worsening left lung base opacity likely combination of atelectasis and pleural fluid. Mild vascular congestion appears similar. Perihilar peribronchial thickening suspicious for mild pulmonary edema, unchanged. No pneumothorax. Unchanged heart size and mediastinal contours. IMPRESSION: 1. Slight worsening left lung base opacity likely  combination of atelectasis and pleural effusion. 2. Exam is otherwise unchanged. Mild central pulmonary edema appears similar.     TTE 01/24/2018 - Left ventricle: The cavity size was normal. There was mild concentric hypertrophy. Systolic function was normal. The estimated ejection fraction was in the range of 55% to 60%. Wall motion was normal; there were no regional wall motion abnormalities. Left ventricular diastolic function parameters were normal. - Aortic valve: There was no regurgitation. - Aortic root: The aortic root was normal in size. - Mitral valve: There was trivial regurgitation. - Right ventricle: The cavity size was normal. Wall thickness was normal. Systolic function was normal. - Tricuspid valve: There was mild regurgitation. - Pulmonic valve: There was trivial regurgitation. - Pulmonary arteries: Systolic pressure was mildly increased. PA peak pressure: 35 mm Hg (S). - Inferior vena cava: The vessel was normal in size. - Pericardium, extracardiac: There was no pericardial effusion. There was a left pleural effusion.  Ekg- sinus- no acute changes  Cardiac cath 01-29-18   Angiographically normal coronary arteries with a large right dominant system  LV end diastolic pressure is low normal  Hyperdynamic Left Ventricular Cardiac Output -likely related to tachycardia  LV end diastolic pressure is normal.  There is no aortic valve stenosis.  There is hyperdynamic left ventricular systolic function. With Echocardiographic evidence of Normal LV Function, this completes cardiac donor evaluation. Angiographically relatively normal coronary arteries with some tortuosity suggestive of hypertension. No notable disease noted.                Anesthesia Post Note  Patient: Joshua Colon Procedure(s) Performed: ORGAN PROCUREMENT, LIVER, KIDNEY, HEART (N/A ) VIDEO BRONCHOSCOPY WITHOUT FLUORO (N/A ) Anesthesia Type:  General Anesthetic complications: no Comments: Patient expired after organs harvested    Signed: Siva P Sivakumar 01/20/2018, 12:48 PM

## 2018-02-15 NOTE — Interval H&P Note (Signed)
History and Physical Interval Note:  02/10/2018 2:55 AM  Joshua Colon  has presented today for surgery, with the diagnosis of Brain Death -donor evaluation Organ Donor  The various methods of treatment have been discussed with the patient and family. After consideration of risks, benefits and other options for treatment, the patient has consented to  Procedure(s): RIGHT/LEFT HEART CATH AND CORONARY ANGIOGRAPHY (N/A) as a surgical intervention .  The patient's history has been reviewed, patient examined, no change in status, stable for surgery.  I have reviewed the patient's chart and labs.  Questions were answered to the patient's satisfaction.     Bryan Lemmaavid Lior Hoen

## 2018-02-15 NOTE — Progress Notes (Signed)
Pt transported to CT and back with no noted distress °

## 2018-02-15 NOTE — Anesthesia Postprocedure Evaluation (Signed)
Anesthesia Post Note  Patient: Joshua Colon  Procedure(s) Performed: ORGAN PROCUREMENT, LIVER, KIDNEY, HEART (N/A ) VIDEO BRONCHOSCOPY WITHOUT FLUORO (N/A )     Anesthesia Type: General Anesthetic complications: no Comments: Patient expired after organs harvested    Last Vitals:  Vitals:   01/16/2018 1215 01/15/2018 1225  BP:    Pulse: (!) 117 (!) 116  Resp: (!) 24 (!) 24  Temp: (!) 36.2 C (!) 36.2 C  SpO2: 97% 97%    Last Pain:  Vitals:   01/25/2018 0800  TempSrc: Core (Comment)  PainSc:                  Tahirah Sara COKER

## 2018-02-15 NOTE — OR Nursing (Signed)
Aorta Cross Clamped [id number]

## 2018-02-15 NOTE — Plan of Care (Signed)
  Not Applicable Education: Knowledge of General Education information will improve 02/04/2018 1259 - Not Applicable by Charlott HollerStramoski, Jalexa Pifer A, RN Health Behavior/Discharge Planning: Ability to manage health-related needs will improve 01/28/2018 1259 - Not Applicable by Charlott HollerStramoski, Treyce Spillers A, RN Clinical Measurements: Ability to maintain clinical measurements within normal limits will improve 02/14/2018 1259 - Not Applicable by Charlott HollerStramoski, Teion Ballin A, RN Will remain free from infection 02/14/2018 1259 - Not Applicable by Charlott HollerStramoski, Chae Oommen A, RN Diagnostic test results will improve 01/20/2018 1259 - Not Applicable by Charlott HollerStramoski, Nonie Lochner A, RN Respiratory complications will improve 01/15/2018 1259 - Not Applicable by Charlott HollerStramoski, Darreon Lutes A, RN Cardiovascular complication will be avoided 01/25/2018 1259 - Not Applicable by Charlott HollerStramoski, Zynia Wojtowicz A, RN Activity: Risk for activity intolerance will decrease 01/25/2018 1259 - Not Applicable by Charlott HollerStramoski, Brandt Chaney A, RN Nutrition: Adequate nutrition will be maintained 01/16/2018 1259 - Not Applicable by Charlott HollerStramoski, Jessamyn Watterson A, RN Coping: Level of anxiety will decrease 02/12/2018 1259 - Not Applicable by Charlott HollerStramoski, Eusevio Schriver A, RN Elimination: Will not experience complications related to bowel motility 01/30/2018 1259 - Not Applicable by Charlott HollerStramoski, Jarrad Mclees A, RN Will not experience complications related to urinary retention 02/09/2018 1259 - Not Applicable by Charlott HollerStramoski, Tommey Barret A, RN Pain Managment: General experience of comfort will improve 01/23/2018 1259 - Not Applicable by Charlott HollerStramoski, Kalen Neidert A, RN Safety: Ability to remain free from injury will improve 02/04/2018 1259 - Not Applicable by Charlott HollerStramoski, Fahima Cifelli A, RN Skin Integrity: Risk for impaired skin integrity will decrease 01/27/2018 1259 - Not Applicable by Charlott HollerStramoski, Yehudah Standing A, RN  CDS organ procurement case.

## 2018-02-15 NOTE — Anesthesia Preprocedure Evaluation (Addendum)
Anesthesia Evaluation  Patient identified by MRN, date of birth, ID band Patient unresponsive    Reviewed: Patient's Chart, lab work & pertinent test results, Unable to perform ROS - Chart review only  Airway Mallampati: Intubated       Dental   Pulmonary     + decreased breath sounds      Cardiovascular  Rhythm:Regular Rate:Tachycardia     Neuro/Psych    GI/Hepatic   Endo/Other    Renal/GU      Musculoskeletal   Abdominal   Peds  Hematology   Anesthesia Other Findings   Reproductive/Obstetrics                            Anesthesia Physical Anesthesia Plan  ASA: VI  Anesthesia Plan: General   Post-op Pain Management:    Induction: Intravenous  PONV Risk Score and Plan:   Airway Management Planned: Oral ETT  Additional Equipment: Arterial line and CVP  Intra-op Plan:   Post-operative Plan:   Informed Consent: I have reviewed the patients History and Physical, chart, labs and discussed the procedure including the risks, benefits and alternatives for the proposed anesthesia with the patient or authorized representative who has indicated his/her understanding and acceptance.     Plan Discussed with: CRNA and Anesthesiologist  Anesthesia Plan Comments: (Patient for organ donation)        Anesthesia Quick Evaluation

## 2018-02-15 DEATH — deceased

## 2018-11-15 IMAGING — CT CT CERVICAL SPINE W/O CM
5 of 8 series · 12 of 33 positions shown, 13 images · non-contrast
Comparison: None.

CLINICAL DATA: 41-year-old male found down in his yard. Apneic and
pulseless upon arrival. Pupils are dilated and nonreactive.

EXAM:
CT HEAD WITHOUT CONTRAST
CT CERVICAL SPINE WITHOUT CONTRAST
TECHNIQUE: Multidetector CT imaging of the head and cervical spine was
performed following the standard protocol without intravenous
contrast. Multiplanar CT image reconstructions of the cervical spine
were also generated.

[Series 5: head bone · axial · 0.47mm/px · z∈[-48,+6]mm · 2 of 82 slices shown]
[im 28/82  bone]
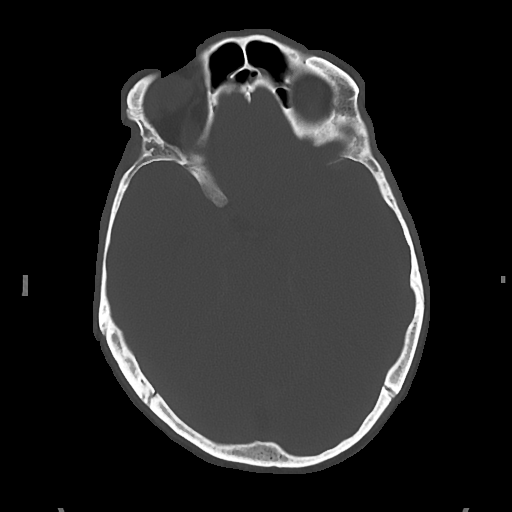
[im 55/82  bone]
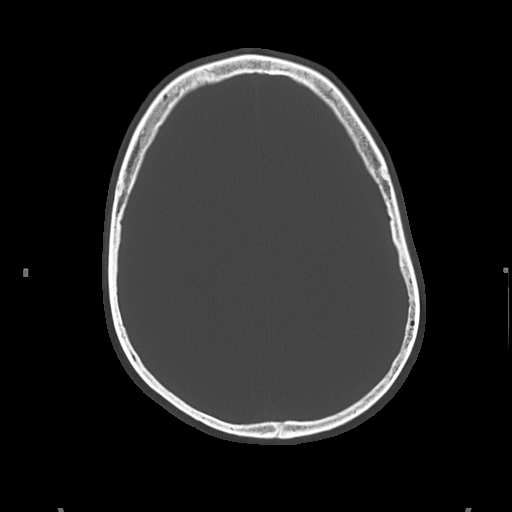

[Series 9: c spine soft · axial · 0.30mm/px · z∈[-208,-106]mm · 3 of 103 slices shown]
[im 26/103  soft-tissue]
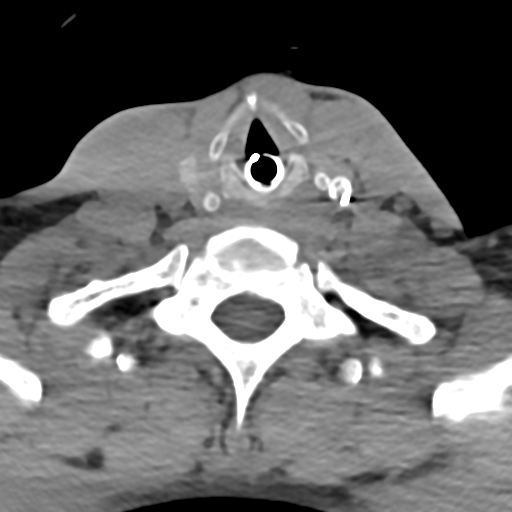
[im 52/103  soft-tissue]
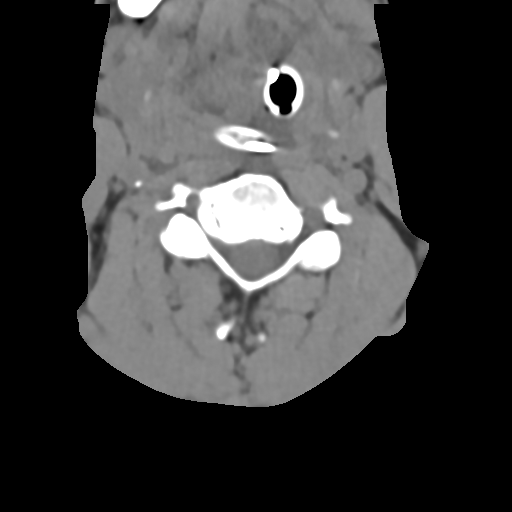
[im 77/103  soft-tissue]
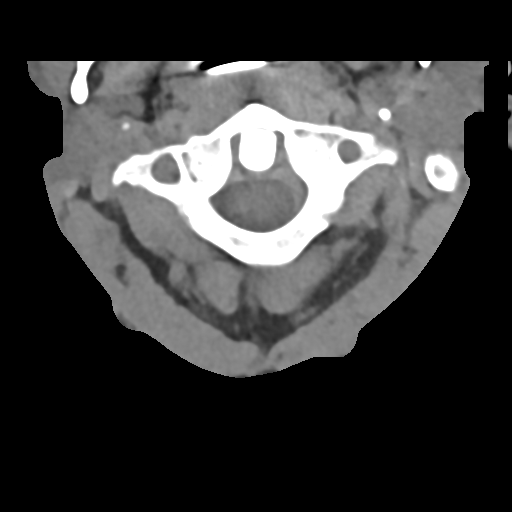

[Series 10: sag bone · sagittal · 0.36mm/px · 4 of 47 slices shown]
[im 10/47  bone]
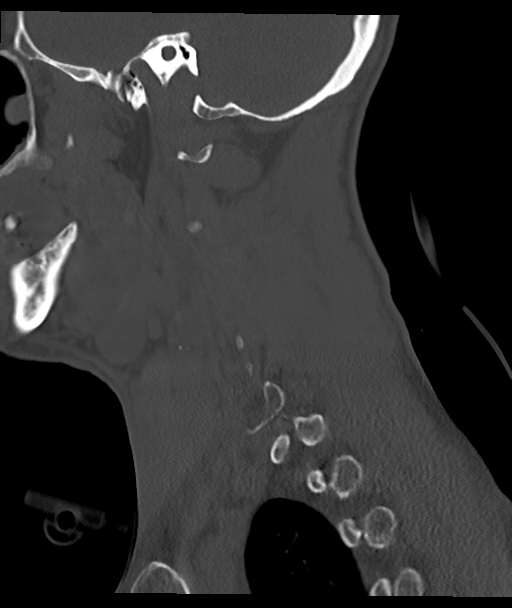
[im 19/47  bone]
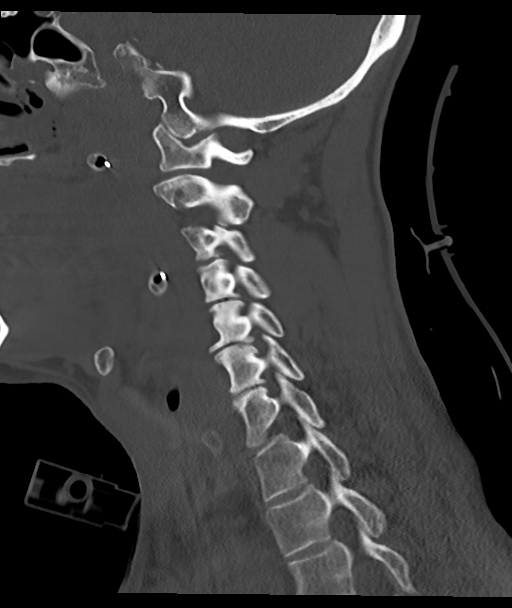
[im 28/47  bone]
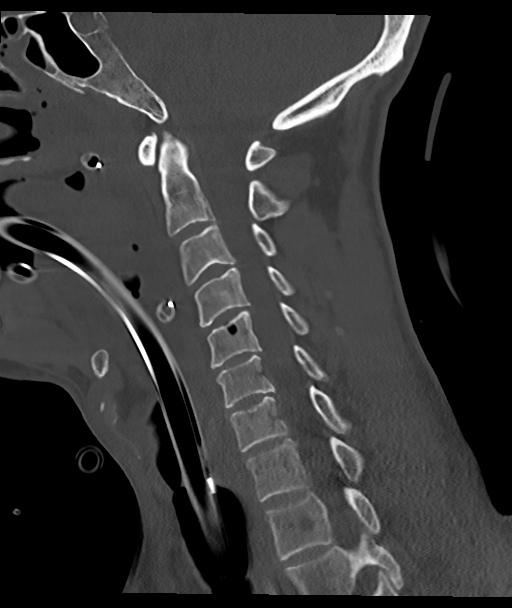
[im 37/47  bone]
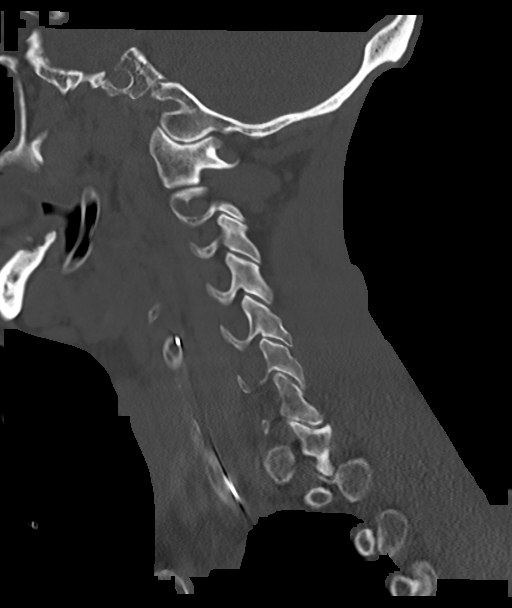

[Series 11: cor bone · coronal · 0.36mm/px · 1 of 53 slices shown]
[im 27/53  bone]
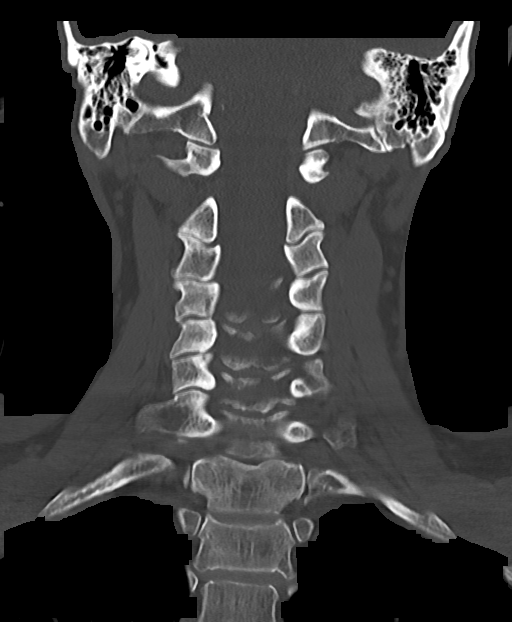

[Series 12: orthogonal axials · axial · 0.21mm/px · z∈[-210,-152]mm · 2 of 98 slices shown, 3 images]
[im 33/98  soft-tissue]
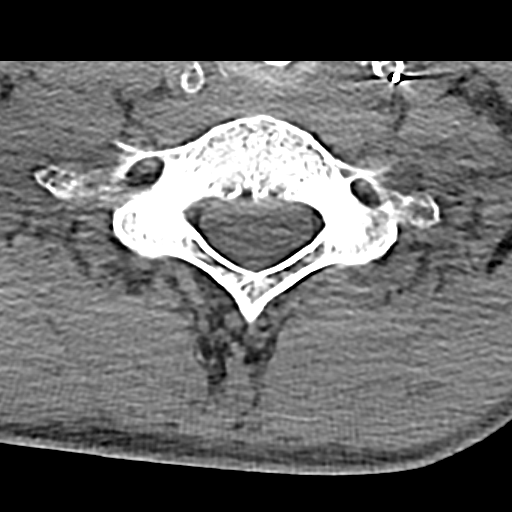
[im 33/98  bone]
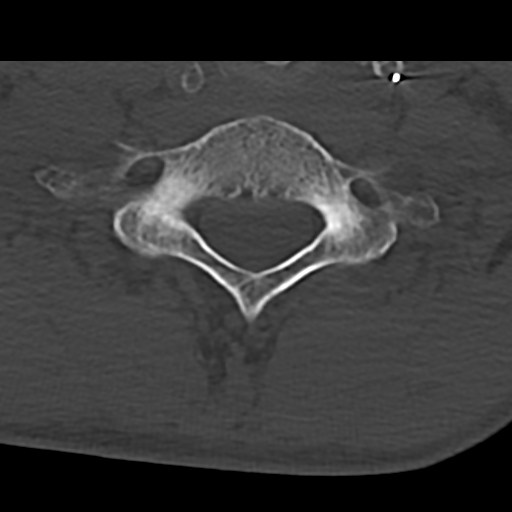
[im 65/98  bone]
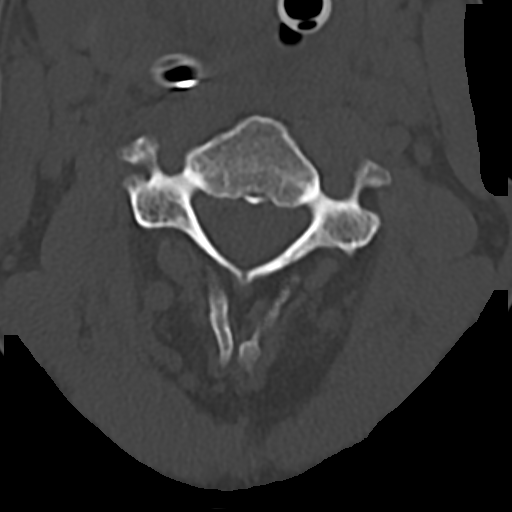

[12 of 33 positions shown; findings below may reference images not displayed]

FINDINGS: CT HEAD FINDINGS:

Brain: Differentiation mild diffuse loss. Diffuse of gray-white
effacement of the cortical sulci. Effacement of the basal cisterns.
All these findings suggest diffuse brain edema, likely from anoxic
injury. No acute intracranial hemorrhage, hydrocephalus or mass
lesion.

Vascular: No hyperdense vessel or unexpected calcification.

Skull: Normal. Negative for fracture or focal lesion.

Sinuses/Orbits: No acute finding.

Other: None.

CT CERVICAL SPINE FINDINGS

Alignment: Normal.

Skull base and vertebrae: No acute fracture. No primary bone lesion
or focal pathologic process.

Soft tissues and spinal canal: No prevertebral fluid or swelling. No
visible canal hematoma.

Disc levels:  No significant degenerative disc disease.

Upper chest: Unremarkable.

Other: Intubated patient. Nasogastric tube is mildly coiled in the
oropharynx.
IMPRESSION: 1. Appearance of the brain suggests diffuse cerebral and cerebellar
edema related to recent anoxic brain injury, as above.
2. No acute abnormality of the cervical spine.

Critical Value/emergent results were called by telephone at the time
of interpretation on 01/16/2018 at [DATE] to Dr. ANAM BLACK, who
verbally acknowledged these results.

## 2018-11-15 IMAGING — DX DG ABD PORTABLE 1V
1 series · 1 of 1 positions shown · non-contrast
Comparison: None.

CLINICAL DATA: Orogastric tube placement.

EXAM:
PORTABLE ABDOMEN - 1 VIEW

[abdomen kub]
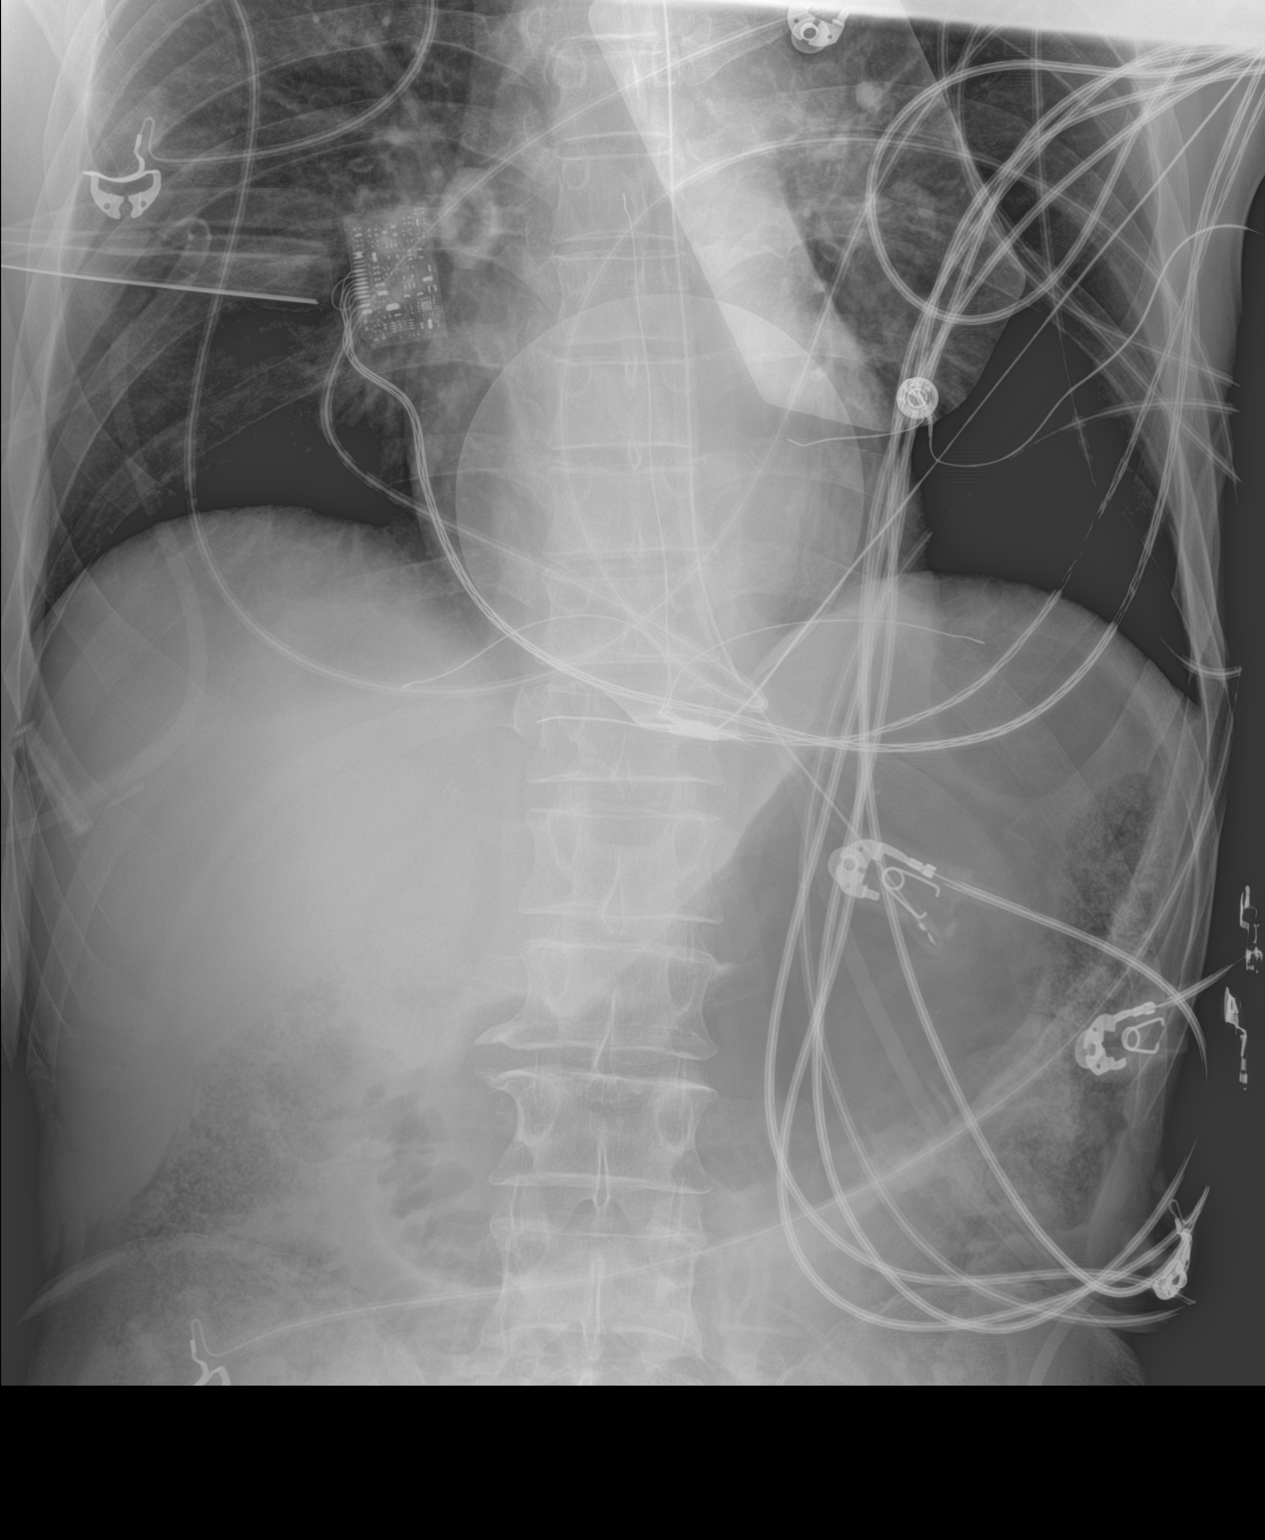

[1 of 1 positions shown; findings below may reference images not displayed]

FINDINGS: Orogastric tube tip passes below the diaphragm. Tip lies in the
proximal to mid stomach. Side hole lies at the level of the
gastroesophageal junction.
IMPRESSION: Orogastric tube tip within the proximal to mid stomach.

## 2018-11-15 IMAGING — DX DG CHEST 1V PORT
1 series · 1 of 1 positions shown · non-contrast
Comparison: None.

CLINICAL DATA: Status post CPR

EXAM:
PORTABLE CHEST 1 VIEW

[chest ap]
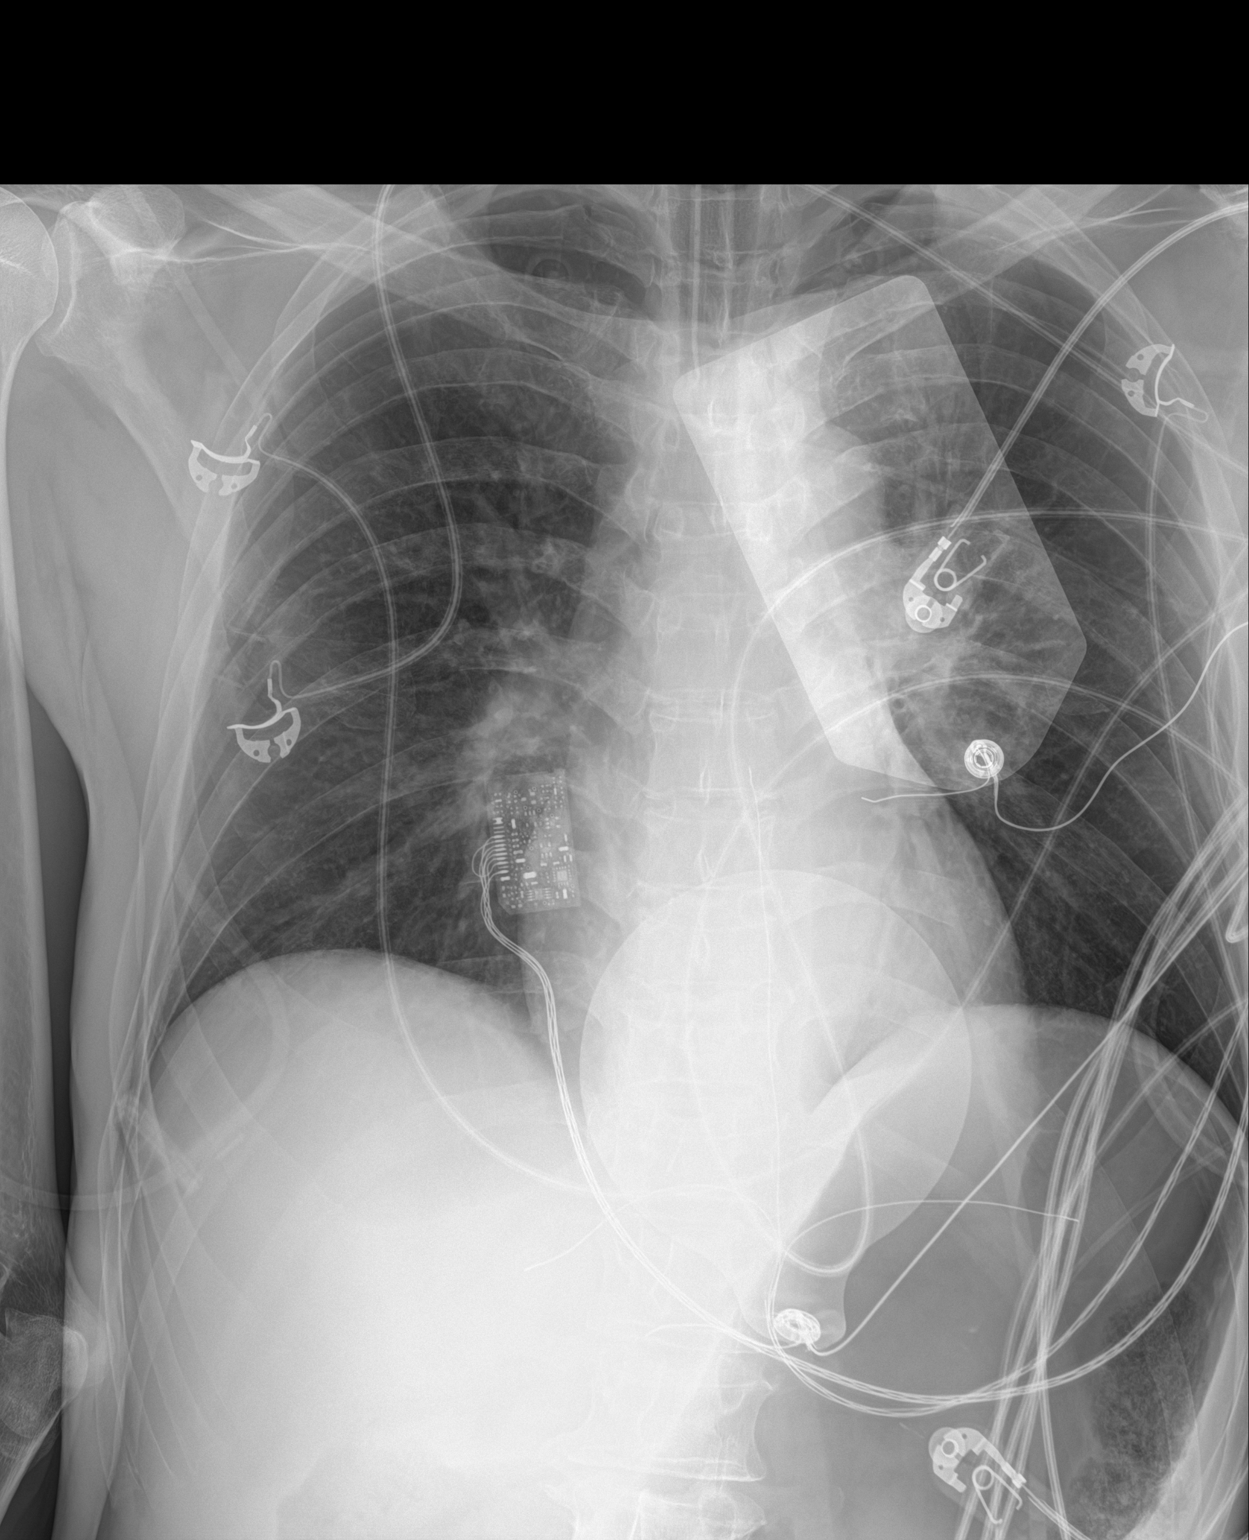

[1 of 1 positions shown; findings below may reference images not displayed]

FINDINGS: Endotracheal tube with the tip 4.9 cm above the carina.

There is no focal parenchymal opacity. There is no pleural effusion
or pneumothorax. The heart and mediastinal contours are
unremarkable.

The osseous structures are unremarkable.
IMPRESSION: Endotracheal tube with the tip 4.9 cm above the carina.

## 2018-11-16 IMAGING — DX DG CHEST 1V
1 series · 1 of 1 positions shown · non-contrast
Comparison: 01/16/2018

CLINICAL DATA: Encounter for central line placement.

EXAM:
CHEST 1 VIEW

[chest]
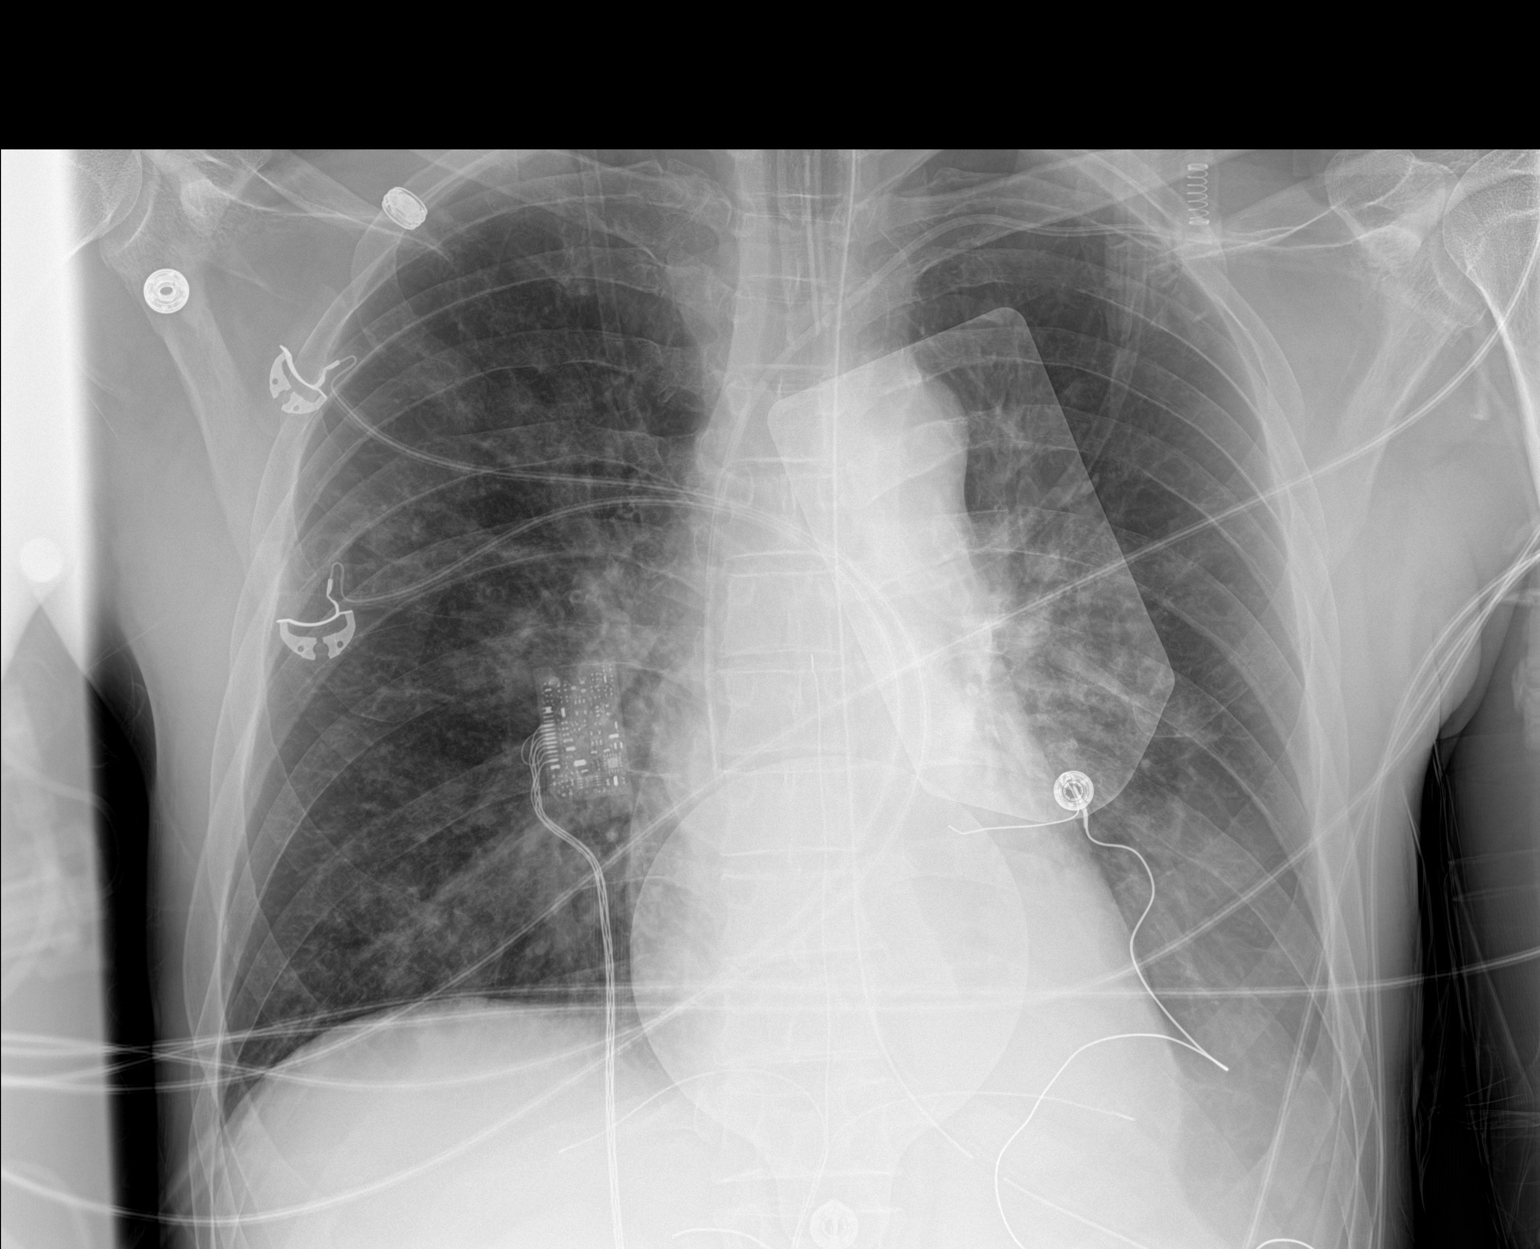

[1 of 1 positions shown; findings below may reference images not displayed]

FINDINGS: New left subclavian central venous line has its tip projecting at
the caval atrial junction.

New nasal/orogastric tube passes below the diaphragm well into the
stomach.

Endotracheal tube is stable and well positioned.

There is new opacity at the left lung base obscuring the left
hemidiaphragm. There is also central vascular congestion and mild
hazy airspace opacity in the perihilar regions, with mild peripheral
interstitial thickening. Findings are consistent pulmonary edema
developing since the prior study. Left lung opacity is likely due to
additional pleural fluid and atelectasis.

No pneumothorax.
IMPRESSION: 1. Left subclavian central venous line catheter tip projects near
the caval atrial junction.
2. No pneumothorax.
3. Worsened lung aeration when compared to the previous day's study
consistent with pulmonary edema. More confluent left lung base
opacity is likely additional left pleural fluid and atelectasis.
4. New nasal/orogastric tube is well positioned.

## 2018-11-17 IMAGING — CT CT CHEST W/O CM
2 of 4 series · 15 of 36 positions shown, 18 images · non-contrast
Comparison: Chest radiograph yesterday. Lung bases from abdominal
CT 01/16/2018

CLINICAL DATA: Organ donor with pleural effusion.

EXAM:
CT CHEST WITHOUT CONTRAST
TECHNIQUE: Multidetector CT imaging of the chest was performed following the
standard protocol without IV contrast.

[Series 3: chest w/o 2mm st · axial · non-contrast · 0.79mm/px · z∈[-802,-554]mm · 12 of 148 slices shown, 15 images]
[im 12/148  mediastinal]
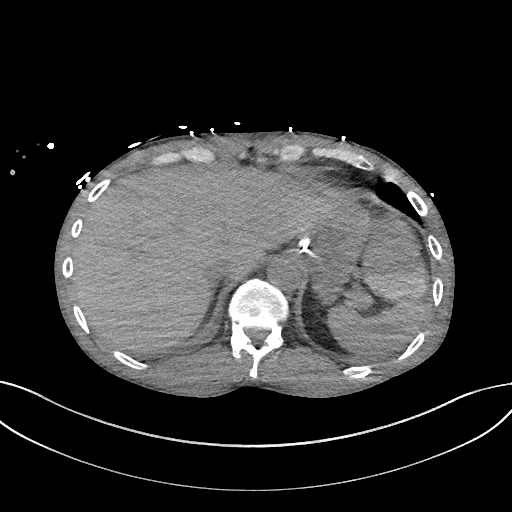
[im 12/148  lung]
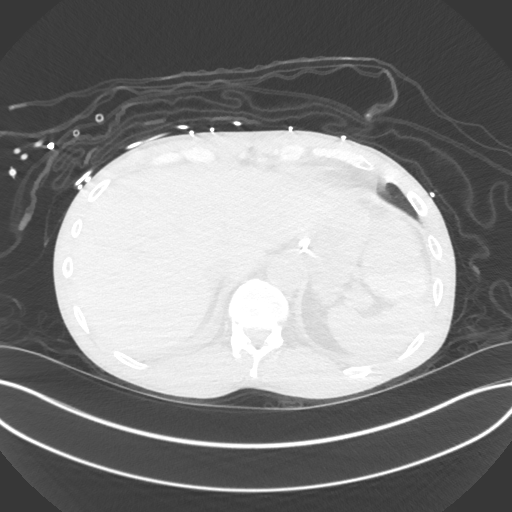
[im 23/148  lung]
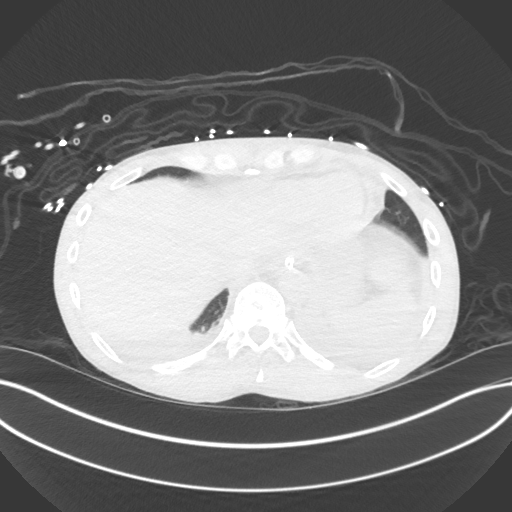
[im 34/148  lung]
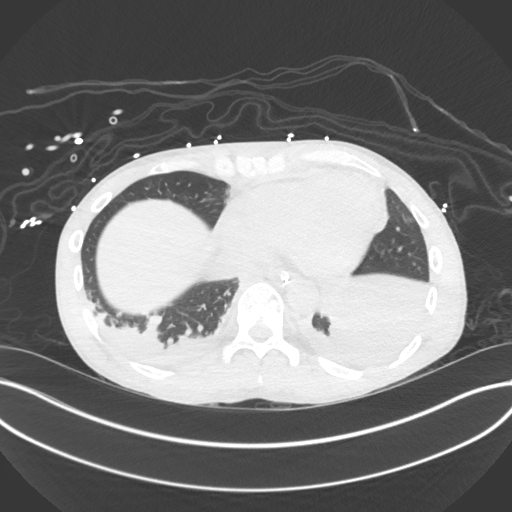
[im 46/148  lung]
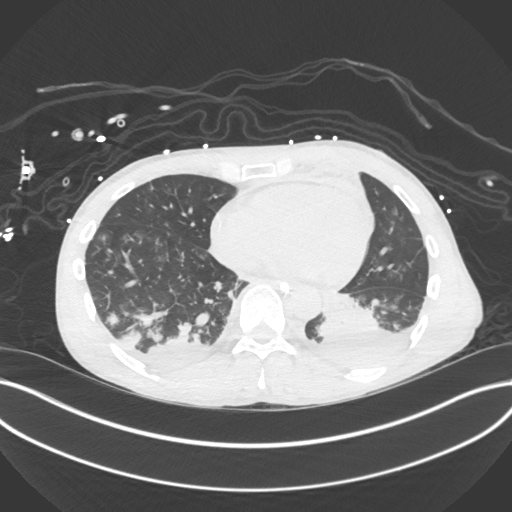
[im 57/148  mediastinal]
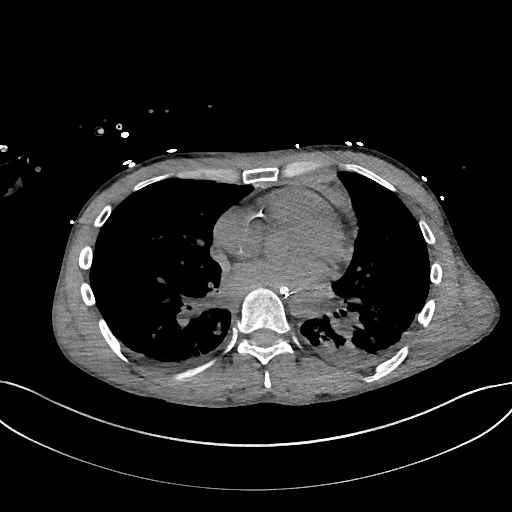
[im 57/148  lung]
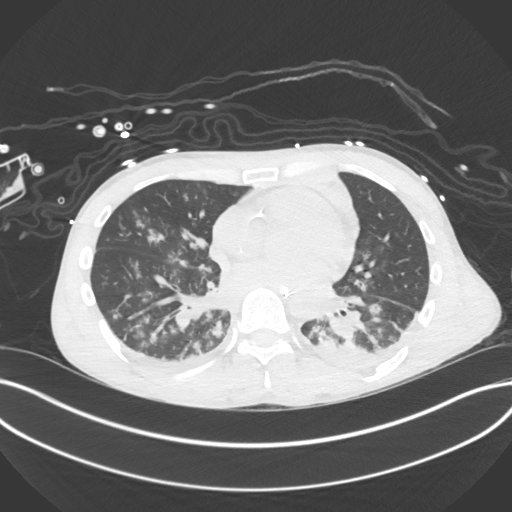
[im 68/148  lung]
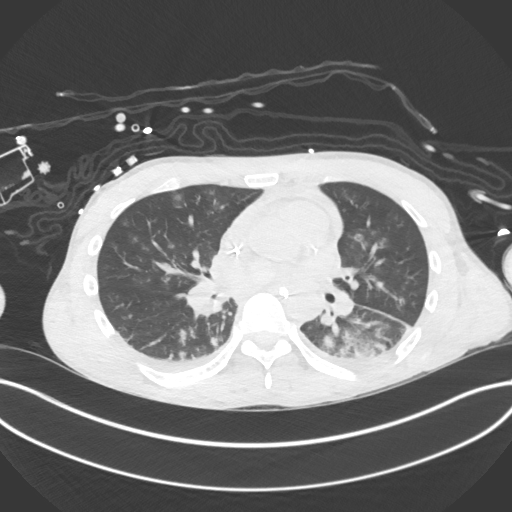
[im 80/148  lung]
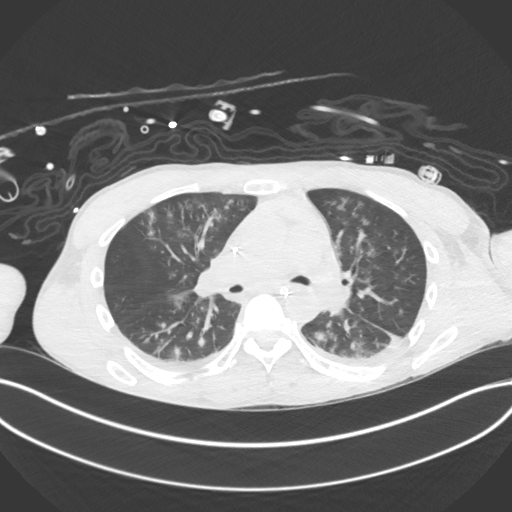
[im 91/148  lung]
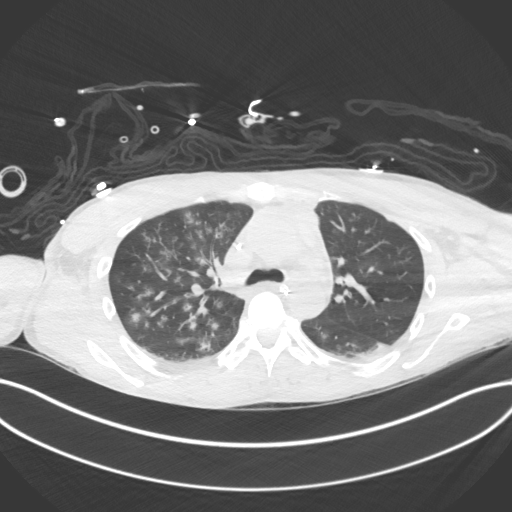
[im 102/148  mediastinal]
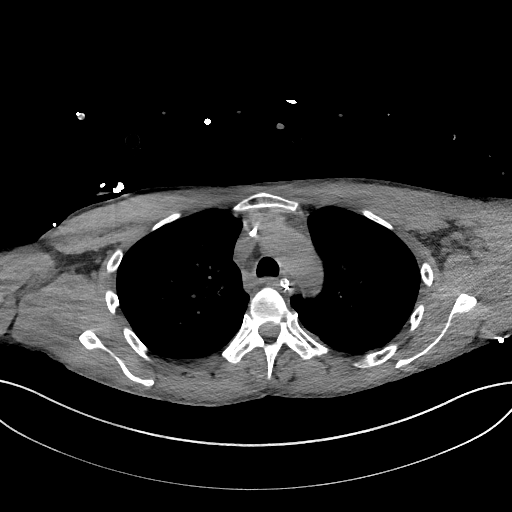
[im 102/148  lung]
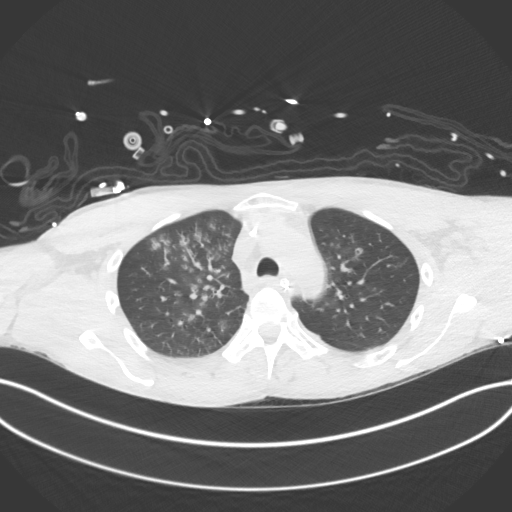
[im 114/148  lung]
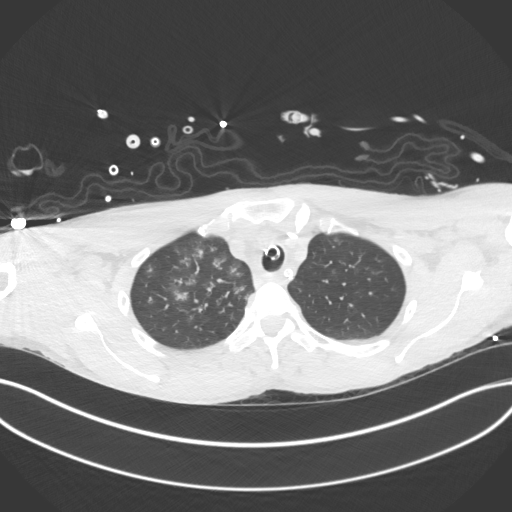
[im 125/148  lung]
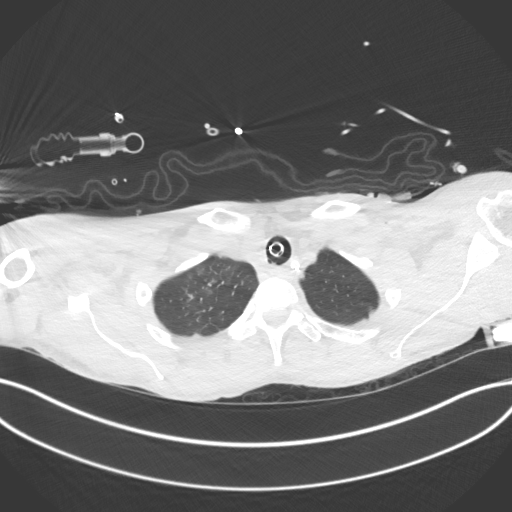
[im 136/148  lung]
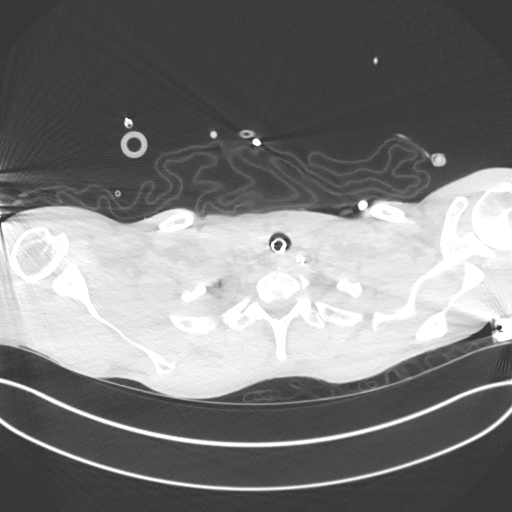

[Series 6: chest w/o 3mm st cor · coronal · non-contrast · 0.59mm/px · 3 of 86 slices shown]
[im 18/86  lung]
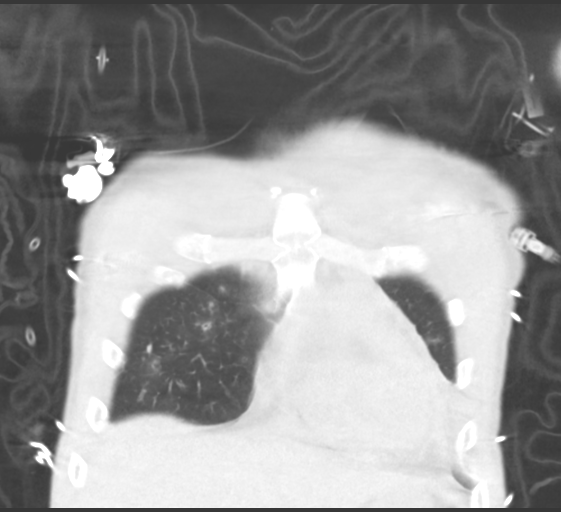
[im 35/86  lung]
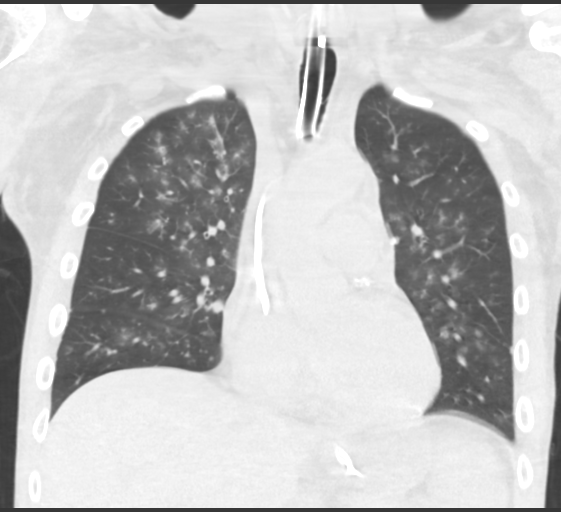
[im 52/86  lung]
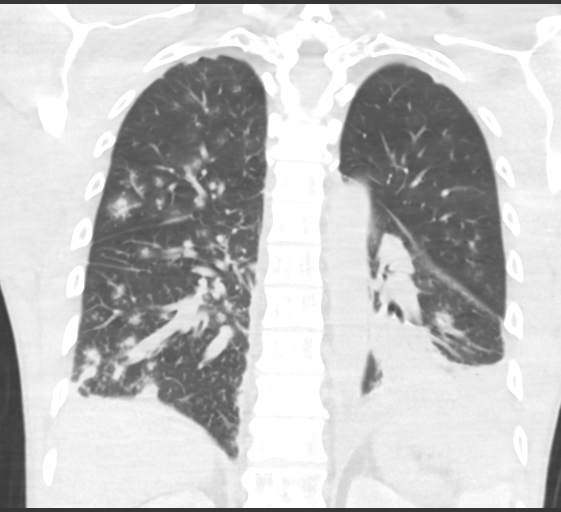

[15 of 36 positions shown; findings below may reference images not displayed]

FINDINGS: Cardiovascular: Thoracic aorta is normal in caliber. The heart is
normal in size. There is a small pericardial effusion. Minimal
coronary artery calcifications. Left subclavian central line tip in
the distal SVC.

Mediastinum/Nodes: Enlarged subcarinal node measures 19 mm short
axis. Soft tissue density in the anterior mediastinum likely
combination of thymus and lymph nodes, superiorly this measures 16
mm short axis. Endotracheal and enteric tubes are in place.

Lungs/Pleura: Left lower lobe consolidation with occlusion of
segmental bronchi, possible aspiration. Similar bronchial filling in
the right lower lobe with bronchial thickening and dependent right
lower lobe consolidation. There are multifocal small irregular
nodular and ground-glass opacities throughout all lobes of both
lungs, many of which demonstrate central necrosis. Nodular opacities
are all subcentimeter. Trace lower lobe septal thickening. Small
bilateral pleural effusions.

Upper Abdomen: Unremarkable noncontrast appearance of the liver,
spleen, pancreas an adrenal glands. Bilateral nephrograms from
abdominal CT 2 days prior.

Musculoskeletal: Mild degenerative change in the spine. No acute
abnormality.
IMPRESSION: 1. Dependent lower lobe consolidations, significantly progressed
from CT 2 days prior with lower lobe bronchial filling, suggesting
aspiration. Small pleural effusions.
2. Multifocal small nodular and ground-glass opacities throughout
both lungs, many of which are cavitary. Findings are nonspecific,
favor infectious or inflammatory in etiology. While ground-glass
nodules can be seen in the setting aspiration, the presence of
central necrosis favors infection, including possible septic emboli.
3. Mediastinal adenopathy is likely reactive.
4. Small pericardial effusion.
5. Mild coronary artery calcifications.
6. Minimal septal thickening can be seen with pulmonary edema.
